# Patient Record
Sex: Female | Born: 1975
Health system: Southern US, Community
[De-identification: ages and names within clinical notes are randomized; demographics above are authoritative.]

## PROBLEM LIST (undated history)

## (undated) DIAGNOSIS — E559 Vitamin D deficiency, unspecified: Secondary | ICD-10-CM

## (undated) DIAGNOSIS — K219 Gastro-esophageal reflux disease without esophagitis: Secondary | ICD-10-CM

## (undated) DIAGNOSIS — M722 Plantar fascial fibromatosis: Secondary | ICD-10-CM

## (undated) DIAGNOSIS — S92912A Unspecified fracture of left toe(s), initial encounter for closed fracture: Secondary | ICD-10-CM

## (undated) DIAGNOSIS — Z8619 Personal history of other infectious and parasitic diseases: Secondary | ICD-10-CM

## (undated) DIAGNOSIS — S9304XA Dislocation of right ankle joint, initial encounter: Secondary | ICD-10-CM

## (undated) DIAGNOSIS — F419 Anxiety disorder, unspecified: Secondary | ICD-10-CM

## (undated) HISTORY — DX: Anxiety disorder, unspecified: F41.9

## (undated) HISTORY — DX: Personal history of other infectious and parasitic diseases: Z86.19

## (undated) HISTORY — DX: Unspecified fracture of left toe(s), initial encounter for closed fracture: S92.912A

## (undated) HISTORY — DX: Plantar fascial fibromatosis: M72.2

## (undated) HISTORY — PX: TENDON REPAIR: SHX5111

## (undated) HISTORY — DX: Dislocation of right ankle joint, initial encounter: S93.04XA

## (undated) HISTORY — DX: Gastro-esophageal reflux disease without esophagitis: K21.9

## (undated) HISTORY — DX: Vitamin D deficiency, unspecified: E55.9

---

## 2014-10-30 ENCOUNTER — Encounter (INDEPENDENT_AMBULATORY_CARE_PROVIDER_SITE_OTHER): Payer: Self-pay

## 2014-10-30 ENCOUNTER — Encounter: Payer: Self-pay | Admitting: Internal Medicine

## 2014-10-30 ENCOUNTER — Ambulatory Visit (INDEPENDENT_AMBULATORY_CARE_PROVIDER_SITE_OTHER): Payer: 59 | Admitting: Internal Medicine

## 2014-10-30 VITALS — BP 108/68 | HR 84 | Temp 98.4°F | Ht 65.0 in | Wt 173.2 lb

## 2014-10-30 DIAGNOSIS — R05 Cough: Secondary | ICD-10-CM

## 2014-10-30 DIAGNOSIS — R059 Cough, unspecified: Secondary | ICD-10-CM

## 2014-10-30 MED ORDER — PANTOPRAZOLE SODIUM 40 MG PO TBEC: 40.0000 mg | DELAYED_RELEASE_TABLET | Freq: Every day | ORAL | Status: DC

## 2014-10-30 NOTE — Progress Notes (Signed)
Subjective:    Patient ID: Sierra Duncan, female    DOB: 09/07/1976,     MRN: [id number]  HPI  37 yowf light smoker stopped 2001 no trouble whatsover then one year p childbirth while breast feeding in 2008  while living in [place] developed sense of sob while breathswere full ? Some better with saba then on and off sob no better on advair until 2013 eval in [place] > neg allergies / some hives from eggs ? Anxious when active rash / rx with prn saba and arrived in [place] Sept 2015 and referred to pulmonary clinic  By Cammie Fulp.  10/30/2014 1st [facility] office visit/ Viraj Liby   Chief Complaint  Patient presents with  . Pulmonary Consult    Referred by Dr. Antony Blackbird. Pt c/o cough and SOB fr the past 4-6 yrs- worse when it rains. She uses ventolin about 1 x per wk on average.     Does wake up on back feeling pressure immediately lies on side and feels better s using saba  Ex does fine but no aerobics all indoors  Cough and tightness usually occur together during the day but note  does not cough noct when awakes with chest pressure.  No obvious other patterns in day to day or daytime variabilty or assoc chronic cough or cp or chest tightness, subjective wheeze overt sinus or hb symptoms. No unusual exp hx or h/o childhood pna/ asthma or knowledge of premature birth.  Sleeping ok without nocturnal  or early am exacerbation  of respiratory  c/o's or need for noct saba. Also denies any obvious fluctuation of symptoms with weather or environmental changes or other aggravating or alleviating factors except as outlined above   Current Medications, Allergies, Complete Past Medical History, Past Surgical History, Family History, and Social History were reviewed in [employer] record.                Review of Systems  Constitutional: Negative for fever, chills and unexpected weight change.  HENT: Negative for congestion, dental problem, ear pain, nosebleeds,  postnasal drip, rhinorrhea, sinus pressure, sneezing, sore throat, trouble swallowing and voice change.   Eyes: Negative for visual disturbance.  Respiratory: Positive for cough and shortness of breath. Negative for choking.   Cardiovascular: Negative for chest pain and leg swelling.  Gastrointestinal: Negative for vomiting, abdominal pain and diarrhea.  Genitourinary: Negative for difficulty urinating.       Acid heartburn  Indigestion  Musculoskeletal: Negative for arthralgias.  Skin: Negative for rash.  Neurological: Negative for tremors, syncope and headaches.  Hematological: Does not bruise/bleed easily.       Objective:   Physical Exam  Amb pleasant wf nad  Wt Readings from Last 3 Encounters:  10/30/14 173 lb 3.2 oz (78.563 kg)    Vital signs reviewed  HEENT: nl dentition, turbinates, and orophanx. Nl external ear canals without cough reflex   NECK :  without JVD/Nodes/TM/ nl carotid upstrokes bilaterally   LUNGS: no acc muscle use, clear to A and P bilaterally without cough on insp or exp maneuvers   CV:  RRR  no s3 or murmur or increase in P2, no edema   ABD:  soft and nontender with nl excursion in the supine position. No bruits or organomegaly, bowel sounds nl  MS:  warm without deformities, calf tenderness, cyanosis or clubbing  SKIN: warm and dry without lesions    NEURO:  alert, approp, no deficits  Assessment & Plan:

## 2014-10-30 NOTE — Patient Instructions (Addendum)
Pantoprazole (protonix) 40 mg   Take 30-60 min before first meal of the day and Zantac 150  one bedtime until return to office - this is the best way to tell whether stomach acid is contributing to your problem.     GERD (REFLUX)  is an extremely common cause of respiratory symptoms just like yours , many times with no obvious heartburn at all.    It can be treated with medication, but also with lifestyle changes including avoidance of late meals, excessive alcohol, smoking cessation, and avoid fatty foods, chocolate, peppermint, colas, red wine, and acidic juices such as orange juice.  NO MINT OR MENTHOL PRODUCTS SO NO COUGH DROPS  USE SUGARLESS CANDY INSTEAD (Jolley ranchers or Stover's or Life Savers) or even ice chips will also do - the key is to swallow to prevent all throat clearing. NO OIL BASED VITAMINS - use powdered substitutes.   Only use your albuterol as a rescue medication to be used if you can't catch your breath by resting or doing a relaxed purse lip breathing pattern.  - The less you use it, the better it will work when you need it. - Ok to use up to 2 puffs  every 4 hours if you must but call for immediate appointment if use goes up over your usual need - Don't leave home without it !!  (think of it like the spare tire for your car)   Please schedule a follow up office visit in 4 weeks, sooner if needed pfts

## 2014-11-01 NOTE — Assessment & Plan Note (Addendum)
The most common causes of chronic cough in immunocompetent adults include the following: upper airway cough syndrome (UACS), previously referred to as postnasal drip syndrome (PNDS), which is caused by variety of rhinosinus conditions; (2) asthma; (3) GERD; (4) chronic bronchitis from cigarette smoking or other inhaled environmental irritants; (5) nonasthmatic eosinophilic bronchitis; and (6) bronchiectasis.   These conditions, singly or in combination, have accounted for up to 94% of the causes of chronic cough in prospective studies.   Other conditions have constituted no >6% of the causes in prospective studies These have included bronchogenic carcinoma, chronic interstitial pneumonia, sarcoidosis, left ventricular failure, ACEI-induced cough, and aspiration from a condition associated with pharyngeal dysfunction.    Chronic cough is often simultaneously caused by more than one condition. A single cause has been found from 38 to 82% of the time, multiple causes from 18 to 62%. Multiply caused cough has been the result of three diseases up to 42% of the time.       Based on hx and exam, this is most likely:  Cough variant asthma or more likley   Upper airway cough syndrome, so named because it's frequently impossible to sort out how much is  CR/sinusitis with freq throat clearing (which can be related to primary GERD)   vs  causing  secondary (" extra esophageal")  GERD from wide swings in gastric pressure that occur with throat clearing, often  promoting self use of mint and menthol lozenges that reduce the lower esophageal sphincter tone and exacerbate the problem further in a cyclical fashion.   These are the same pts (now being labeled as having "irritable larynx syndrome" by some cough centers) who not infrequently have a history of having failed to tolerate ace inhibitors,  dry powder inhalers or biphosphonates or report having atypical reflux symptoms that don't respond to standard doses of PPI  , and are easily confused as having aecopd or asthma flares by even experienced allergists/ pulmonologists.   The first step is to maximize acid suppression   then regroup if the cough  In 4 weeks with pfts and proceed to methacholine challenge if cough persists.  See instructions for specific recommendations which were reviewed directly with the patient who was given a copy with highlighter outlining the key components.

## 2014-11-27 ENCOUNTER — Ambulatory Visit (INDEPENDENT_AMBULATORY_CARE_PROVIDER_SITE_OTHER): Payer: 59 | Admitting: Internal Medicine

## 2014-11-27 ENCOUNTER — Encounter: Payer: Self-pay | Admitting: Internal Medicine

## 2014-11-27 ENCOUNTER — Ambulatory Visit (INDEPENDENT_AMBULATORY_CARE_PROVIDER_SITE_OTHER)
Admission: RE | Admit: 2014-11-27 | Discharge: 2014-11-27 | Disposition: A | Discharge status: Home / Self Care | Payer: 59 | Source: Ambulatory Visit | Attending: Internal Medicine | Admitting: Internal Medicine

## 2014-11-27 DIAGNOSIS — R059 Cough, unspecified: Secondary | ICD-10-CM

## 2014-11-27 DIAGNOSIS — R05 Cough: Secondary | ICD-10-CM

## 2014-11-27 LAB — PULMONARY FUNCTION TEST
DL/VA % pred: 105 %
DL/VA: 5.21 ml/min/mmHg/L
DLCO UNC % PRED: 109 %
DLCO unc: 28.01 ml/min/mmHg
FEF 25-75 POST: 3.18 L/s
FEF 25-75 Pre: 2.79 L/sec
FEF2575-%Change-Post: 14 %
FEF2575-%PRED-POST: 97 %
FEF2575-%Pred-Pre: 85 %
FEV1-%Change-Post: 3 %
FEV1-%PRED-POST: 105 %
FEV1-%PRED-PRE: 101 %
FEV1-POST: 3.31 L
FEV1-PRE: 3.19 L
FEV1FVC-%Change-Post: 4 %
FEV1FVC-%PRED-PRE: 93 %
FEV6-%Change-Post: 0 %
FEV6-%Pred-Post: 108 %
FEV6-%Pred-Pre: 109 %
FEV6-Post: 4.09 L
FEV6-Pre: 4.12 L
FEV6FVC-%CHANGE-POST: 0 %
FEV6FVC-%Pred-Post: 102 %
FEV6FVC-%Pred-Pre: 102 %
FVC-%CHANGE-POST: 0 %
FVC-%PRED-PRE: 107 %
FVC-%Pred-Post: 106 %
FVC-Post: 4.09 L
FVC-Pre: 4.12 L
POST FEV1/FVC RATIO: 81 %
PRE FEV1/FVC RATIO: 77 %
PRE FEV6/FVC RATIO: 100 %
Post FEV6/FVC ratio: 100 %
RV % PRED: 96 %
RV: 1.55 L
TLC % pred: 107 %
TLC: 5.6 L

## 2014-11-27 NOTE — Progress Notes (Signed)
Subjective:    Patient ID: Sierra Duncan, female    DOB: 14-Jul-1976    MRN: [id number]    Brief patient profile: 10 yowf light smoker stopped 2001 no trouble whatsover then one year p childbirth while breast feeding in 2008  while living in [place] developed sense of sob while breasts were full ? Some better with saba then on and off sob no better on advair until 2013 eval in [place] > neg allergies / some hives from eggs ? More Anxious when active rash assoc sob/ rx with prn saba and arrived in [place] Sept 2015 and referred to pulmonary clinic 10/30/14   By Cammie Fulp    History of Present Illness  10/30/2014 1st [facility] office visit/ Wert   Chief Complaint  Patient presents with  . Pulmonary Consult    Referred by Dr. Antony Blackbird. Pt c/o cough and SOB fr the past 4-6 yrs- worse when it rains. She uses ventolin about 1 x per wk on average.   Does wake up on back feeling pressure immediately better p lies on side and feels better s using saba  Ex does fine but no aerobics, all indoors  Cough and tightness usually occur together during the day but note  does not cough noct when awakes with chest pressure. rec Pantoprazole (protonix) 40 mg   Take 30-60 min before first meal of the day and Zantac 150  one bedtime until return to office - this is the best way to tell whether stomach acid is contributing to your problem.   GERD diet  Only use your albuterol as a rescue medication     11/27/2014 f/u ov/Wert re:  Chief Complaint  Patient presents with  . Follow-up    PFT done today. Pt states that her cough and SOB are unchanged. No new co's today. She uses rescue inhaler on average 2 x per month.    no longer having problems lying on back, good activity tolerance. Main issue about half the mornings she  wakes up then coughs mostly dry but lasts up to 2 h,  not consistently on hs h2 as rec    No obvious day to day or daytime variabilty or assoc excess or pululent sputum or cp or  chest tightness, subjective wheeze overt sinus or hb symptoms. No unusual exp hx or h/o childhood pna/ asthma or knowledge of premature birth.  Sleeping ok without nocturnal  or early am exacerbation  of respiratory  c/o's or need for noct saba. Also denies any obvious fluctuation of symptoms with weather or environmental changes or other aggravating or alleviating factors except as outlined above   Current Medications, Allergies, Complete Past Medical History, Past Surgical History, Family History, and Social History were reviewed in [employer] record.  ROS  The following are not active complaints unless bolded sore throat, dysphagia, dental problems, itching, sneezing,  nasal congestion or excess/ purulent secretions, ear ache,   fever, chills, sweats, unintended wt loss, pleuritic or exertional cp, hemoptysis,  orthopnea pnd or leg swelling, presyncope, palpitations, heartburn, abdominal pain, anorexia, nausea, vomiting, diarrhea  or change in bowel or urinary habits, change in stools or urine, dysuria,hematuria,  rash, arthralgias, visual complaints, headache, numbness weakness or ataxia or problems with walking or coordination,  change in mood/affect or memory.                          Objective:   Physical Exam  Amb pleasant wf nad  Wt Readings from Last 3 Encounters:  11/27/14 172 lb (78.019 kg)  10/30/14 173 lb 3.2 oz (78.563 kg)    Vital signs reviewed    Vital signs reviewed  HEENT: nl dentition, turbinates, and orophanx. Nl external ear canals without cough reflex   NECK :  without JVD/Nodes/TM/ nl carotid upstrokes bilaterally   LUNGS: no acc muscle use, clear to A and P bilaterally without cough on insp or exp maneuvers   CV:  RRR  no s3 or murmur or increase in P2, no edema   ABD:  soft and nontender with nl excursion in the supine position. No bruits or organomegaly, bowel sounds nl  MS:  warm without deformities, calf tenderness,  cyanosis or clubbing     CXR PA and Lateral:   11/27/2014 : No active cardiopulmonary disease.      Assessment & Plan:

## 2014-11-27 NOTE — Progress Notes (Signed)
PFT done today. 

## 2014-11-27 NOTE — Patient Instructions (Addendum)
Please remember to go to the   x-ray department downstairs for your tests - we will call you with the results when they are available.    Pantoprazole (protonix) 40 mg   Take 30-60 min before first meal of the day and Zantac 150  one bedtime  Plus chlortrimeton 4 mg take 1-2 at bedtime  For daytime tickle/itching/ drainage>also  take chlortrimeton (chlorpheniramine) 4 mg every 4 hours available over the counter (may cause drowsiness)   GERD (REFLUX)  is an extremely common cause of respiratory symptoms just like yours , many times with no obvious heartburn at all.    It can be treated with medication, but also with lifestyle changes including avoidance of late meals, excessive alcohol, smoking cessation, and avoid fatty foods, chocolate, peppermint, colas, red wine, and acidic juices such as orange juice.  NO MINT OR MENTHOL PRODUCTS SO NO COUGH DROPS  USE SUGARLESS CANDY INSTEAD (Jolley ranchers or Stover's or Life Savers) or even ice chips will also do - the key is to swallow to prevent all throat clearing. NO OIL BASED VITAMINS - use powdered substitutes.   If you are satisfied with your treatment plan,  let your doctor know and he/she can either refill your medications or you can return here when your prescription runs out.     If in any way you are not 100% satisfied,  please tell us.  If 100% better, tell your friends!  Pulmonary follow up is as needed - Dr Neldon Mc could see for the itching issue if persists

## 2014-11-28 NOTE — Progress Notes (Signed)
Quick Note:  LMTCB ______ 

## 2014-11-30 NOTE — Assessment & Plan Note (Addendum)
-   GERD rx started 10/30/14 > improved 11/27/14  - PFTs 11/27/14 perfectly nl including fef 25-75 off all inhalers   I had an extended discussion with the patient reviewing all relevant studies completed to date and  lasting 15 to 20 minutes of a 25 minute visit on the following ongoing concerns:   1) no evidence at all to support asthma or any airflow obst of any kind 2) improvement on gerd rx strongly suggests LPR which may be partly related to effects of body habitus or use of bcp's or ongoing dietary factors  She does have problem with hives and gets anxious with this which may contribute to her resp complaints so would certainly consider referral to Dr Neldon Mc wot sort this out   Pulmonary f/u can be prn

## 2014-12-03 ENCOUNTER — Other Ambulatory Visit: Payer: Self-pay | Admitting: Internal Medicine

## 2014-12-03 MED ORDER — PANTOPRAZOLE SODIUM 40 MG PO TBEC: 40.0000 mg | DELAYED_RELEASE_TABLET | Freq: Every day | ORAL | Status: DC

## 2015-02-05 ENCOUNTER — Other Ambulatory Visit: Payer: Self-pay | Admitting: Family Medicine

## 2015-02-05 DIAGNOSIS — N644 Mastodynia: Secondary | ICD-10-CM

## 2015-02-05 DIAGNOSIS — N6312 Unspecified lump in the right breast, upper inner quadrant: Secondary | ICD-10-CM

## 2015-02-11 ENCOUNTER — Ambulatory Visit
Admission: RE | Admit: 2015-02-11 | Discharge: 2015-02-11 | Disposition: A | Discharge status: Home / Self Care | Payer: 59 | Source: Ambulatory Visit | Attending: Family Medicine | Admitting: Family Medicine

## 2015-02-11 DIAGNOSIS — N6312 Unspecified lump in the right breast, upper inner quadrant: Secondary | ICD-10-CM

## 2015-02-11 DIAGNOSIS — N644 Mastodynia: Secondary | ICD-10-CM

## 2015-02-17 ENCOUNTER — Other Ambulatory Visit: Payer: Self-pay | Admitting: *Deleted

## 2015-02-17 DIAGNOSIS — M79606 Pain in leg, unspecified: Secondary | ICD-10-CM

## 2015-02-17 DIAGNOSIS — M7989 Other specified soft tissue disorders: Secondary | ICD-10-CM

## 2015-03-31 ENCOUNTER — Encounter: Payer: 59 | Admitting: Vascular Surgery

## 2015-03-31 ENCOUNTER — Encounter (HOSPITAL_COMMUNITY): Payer: 59

## 2016-02-08 ENCOUNTER — Other Ambulatory Visit: Payer: Self-pay | Admitting: Family Medicine

## 2016-02-08 ENCOUNTER — Other Ambulatory Visit (HOSPITAL_COMMUNITY)
Admission: RE | Admit: 2016-02-08 | Discharge: 2016-02-08 | Disposition: A | Discharge status: Home / Self Care | Payer: 59 | Source: Ambulatory Visit | Attending: Family Medicine | Admitting: Family Medicine

## 2016-02-08 DIAGNOSIS — Z01419 Encounter for gynecological examination (general) (routine) without abnormal findings: Secondary | ICD-10-CM | POA: Insufficient documentation

## 2016-02-08 LAB — LIPID PANEL
Cholesterol: 224 mg/dL — AB (ref 0–200)
HDL: 42 mg/dL (ref 35–70)
LDL CALC: 182 mg/dL
LDL/HDL RATIO: 5.3
TRIGLYCERIDES: 242 mg/dL — AB (ref 40–160)

## 2016-02-08 LAB — BASIC METABOLIC PANEL
BUN: 19 mg/dL (ref 4–21)
CREATININE: 0.7 mg/dL (ref 0.5–1.1)
Glucose: 96 mg/dL
Potassium: 3.8 mmol/L (ref 3.4–5.3)
SODIUM: 139 mmol/L (ref 137–147)

## 2016-02-08 LAB — HM PAP SMEAR

## 2016-02-09 LAB — CYTOLOGY - PAP

## 2016-03-31 ENCOUNTER — Other Ambulatory Visit: Payer: Self-pay | Admitting: Orthopedic Surgery

## 2016-03-31 DIAGNOSIS — M5412 Radiculopathy, cervical region: Secondary | ICD-10-CM

## 2016-04-08 ENCOUNTER — Other Ambulatory Visit: Payer: 59

## 2016-04-13 ENCOUNTER — Ambulatory Visit
Admission: RE | Admit: 2016-04-13 | Discharge: 2016-04-13 | Disposition: A | Discharge status: Home / Self Care | Payer: 59 | Source: Ambulatory Visit | Attending: Orthopedic Surgery | Admitting: Orthopedic Surgery

## 2016-04-13 DIAGNOSIS — M5412 Radiculopathy, cervical region: Secondary | ICD-10-CM

## 2016-04-25 ENCOUNTER — Ambulatory Visit (INDEPENDENT_AMBULATORY_CARE_PROVIDER_SITE_OTHER): Payer: 59 | Admitting: Neurology

## 2016-04-25 ENCOUNTER — Encounter: Payer: Self-pay | Admitting: Neurology

## 2016-04-25 VITALS — BP 135/83 | HR 87 | Ht 65.0 in | Wt 185.6 lb

## 2016-04-25 DIAGNOSIS — M542 Cervicalgia: Secondary | ICD-10-CM

## 2016-04-25 DIAGNOSIS — R208 Other disturbances of skin sensation: Secondary | ICD-10-CM

## 2016-04-25 DIAGNOSIS — G518 Other disorders of facial nerve: Secondary | ICD-10-CM

## 2016-04-25 DIAGNOSIS — R200 Anesthesia of skin: Secondary | ICD-10-CM | POA: Insufficient documentation

## 2016-04-25 NOTE — Progress Notes (Signed)
[facility] [address] [place]. [place] [address] [phone]       OFFICE CONSULT NOTE  Ms. Sierra Duncan Date of Birth:  Aug 05, 1976 Medical Record Number:  [id number]   Referring MD:  Sharol Harness  Reason for Referral: Left face numbness  HPI: 40 year Caucasian lady who's been having intermittent left facial numbness since the last 1 year. She actually states that she had somewhat similar episode at age 40 when with sudden neck extension she noticed left facial numbness. This lasted a few days and resolved. She did not get medical help at that time. A year ago now when she was in the shower and extending her neck up she noticed numbness involving the left half of the face. Since then this seems to occur off and on. This is often brought on by neck movements and activities which involve extension. She can feel tightness over the left occipital groove and at times by local pressure better she can avoid development of numbness involving the whole face. She recently had a bout of coughing following upper respiratory tract infection which had triggered the most recent bout of numbness. She denies neck pain, shooting pain, neuralgic pain. She is quite active and exercises regularly in the gym. She recently had MRI scan of the cervical spine performed on 04/13/16 which I have personally reviewed shows no abnormality. She has had no injections into the left occipital groove point was fine. She denies any other multiple sclerosis-like symptoms in the form of extremity numbness, weakness, ataxia, vertigo, diplopia, or bladder incontinence or excessive fatigue. She is overall quite healthy except mild anxiety and gastric esophageal reflux. She recently underwent right ankle surgery in April 2017 and is currently wearing a right foot boot  ROS:   14 system review of systems is positive for numbness, anxiety and all other systems negative PMH:  Past Medical History  Diagnosis Date   . Anxiety   . GERD (gastroesophageal reflux disease)     Social History:  Social History   Social History  . Marital Status: Married    Spouse Name: N/A  . Number of Children: N/A  . Years of Education: N/A   Occupational History  . Homemaker    Social History Main Topics  . Smoking status: Former Smoker -- 0.25 packs/day for 5 years    Types: Cigarettes    Quit date: 12/19/1998  . Smokeless tobacco: Never Used  . Alcohol Use: 1.8 oz/week    1 Glasses of wine, 1 Cans of beer, 1 Shots of liquor, 0 Standard drinks or equivalent per week     Comment: 5 drinks per wk,doies weekly  . Drug Use: No  . Sexual Activity: Not on file   Other Topics Concern  . Not on file   Social History Narrative    Medications:   Current Outpatient Prescriptions on File Prior to Visit  Medication Sig Dispense Refill  . ALPRAZolam (XANAX) 0.5 MG tablet Take 0.25 mg by mouth 2 (two) times daily as needed for anxiety.    . ranitidine (ZANTAC) 75 MG tablet Take 75 mg by mouth daily as needed for heartburn. Reported on 04/25/2016     No current facility-administered medications on file prior to visit.    Allergies:   Allergies  Allergen Reactions  . Codeine Shortness Of Breath  . Tramadol Shortness Of Breath  . Eggs Or Egg-Derived Products Hives  . Fluoxetine Hives  . Macrobid [Nitrofurantoin] Hives  . Septra [Sulfamethoxazole-Trimethoprim] Hives  Physical Exam General: well developed, well nourished, seated, in no evident distress Head: head normocephalic and atraumatic.   Neck: supple with no carotid or supraclavicular bruits Cardiovascular: regular rate and rhythm, no murmurs Musculoskeletal: no deformity.Slight tenderness left.  occipital groove but Tinel sign is negative. Wearing a right foot boot Skin:  no rash/petichiae Vascular:  Normal pulses all extremities  Neurologic Exam Mental Status: Awake and fully alert. Oriented to place and time. Recent and remote memory intact.  Attention span, concentration and fund of knowledge appropriate. Mood and affect appropriate.  Cranial Nerves: Fundoscopic exam reveals sharp disc margins. Pupils equal, briskly reactive to light. Extraocular movements full without nystagmus. Visual fields full to confrontation. Hearing intact. Facial sensation intact. Face, tongue, palate moves normally and symmetrically.  Motor: Normal bulk and tone. Normal strength in all tested extremity muscles. Sensory.: intact to touch , pinprick , position and vibratory sensation.  Coordination: Rapid alternating movements normal in all extremities. Finger-to-nose and heel-to-shin performed accurately bilaterally. Gait and Station: Arises from chair without difficulty. Stance is normal. Gait demonstrates normal stride length and balance . Able to heel, toe and tandem walk without difficulty.  Reflexes: 1+ and symmetric. Toes downgoing.       ASSESSMENT: 71 year Caucasian lady with intermittent left facial numbness possibly from occipital nerve entrapment. Her clinical presentation is not typical for occipital neuralgia though her positional symptoms are suggestive of trigeminal nerve involvement.    PLAN: I had a long discussion with the patient regarding her symptoms of intermittent left facial numbness related to neck extension likely representing mild occipital neuralgia and absence of pain makes it unusual. I recommend checking MRI scan of the brain to look for craniovertebral junction lesions or brainstem involvement. Her symptoms are not frequent and severe enough to justify medications at the present time. She was advised to avoid sudden neck extensions. Greater than 50% time during this 30 minute consultation was spent on counseling and coordination of care about patient numbness She will return for follow-up in the future only as needed. Antony Contras, MD  [facility] [address] [place], [place]  [address]  Phone [phone] Fax [phone]   Note: This document was prepared with digital dictation and possible smart phrase technology. Any transcriptional errors that result from this process are unintentional.

## 2016-04-25 NOTE — Patient Instructions (Signed)
I had a long discussion with the patient regarding her symptoms of intermittent left facial numbness related to neck extension likely representing mild occipital neuralgia and absence of pain makes it unusual. I recommend checking MRI scan of the brain to look for craniovertebral junction lesions or brainstem involvement. Her symptoms are not frequent and severe enough to justify medications at the present time. She was advised to avoid sudden neck extensions She will return for follow-up in the future only as needed.

## 2016-05-04 ENCOUNTER — Ambulatory Visit (INDEPENDENT_AMBULATORY_CARE_PROVIDER_SITE_OTHER): Payer: 59

## 2016-05-04 DIAGNOSIS — R208 Other disturbances of skin sensation: Secondary | ICD-10-CM

## 2016-05-04 DIAGNOSIS — G518 Other disorders of facial nerve: Secondary | ICD-10-CM

## 2016-05-04 DIAGNOSIS — R200 Anesthesia of skin: Secondary | ICD-10-CM

## 2016-05-04 MED ORDER — GADOPENTETATE DIMEGLUMINE 469.01 MG/ML IV SOLN: 17.0000 mL | Freq: Once | INTRAVENOUS | Status: AC | PRN

## 2016-05-17 ENCOUNTER — Telehealth: Payer: Self-pay

## 2016-05-17 NOTE — Telephone Encounter (Signed)
Rn call patient to give her MRI results. Rn stated he MRI was normal. Pt verbalized understanding.

## 2016-05-17 NOTE — Telephone Encounter (Signed)
-----   Message from Garvin Fila, MD sent at 05/13/2016  4:01 PM EDT ----- Mitchell Heir inform the patient that MRI brain study was normal

## 2016-08-08 LAB — LIPID PANEL
CHOLESTEROL: 206 mg/dL — AB (ref 0–200)
HDL: 44 mg/dL (ref 35–70)
LDL CALC: 162 mg/dL
Triglycerides: 154 mg/dL (ref 40–160)

## 2016-08-08 LAB — BASIC METABOLIC PANEL: Glucose: 96 mg/dL

## 2016-12-01 ENCOUNTER — Encounter: Payer: Self-pay | Admitting: General Practice

## 2017-01-16 DIAGNOSIS — M722 Plantar fascial fibromatosis: Secondary | ICD-10-CM | POA: Diagnosis not present

## 2017-01-16 DIAGNOSIS — M79671 Pain in right foot: Secondary | ICD-10-CM | POA: Diagnosis not present

## 2017-02-08 DIAGNOSIS — R1011 Right upper quadrant pain: Secondary | ICD-10-CM | POA: Diagnosis not present

## 2017-02-09 ENCOUNTER — Other Ambulatory Visit: Payer: Self-pay | Admitting: Family Medicine

## 2017-02-09 ENCOUNTER — Other Ambulatory Visit: Payer: Self-pay | Admitting: Family

## 2017-02-09 DIAGNOSIS — R1011 Right upper quadrant pain: Secondary | ICD-10-CM

## 2017-02-09 DIAGNOSIS — M722 Plantar fascial fibromatosis: Secondary | ICD-10-CM | POA: Diagnosis not present

## 2017-02-16 ENCOUNTER — Ambulatory Visit
Admission: RE | Admit: 2017-02-16 | Discharge: 2017-02-16 | Disposition: A | Discharge status: Home / Self Care | Payer: 59 | Source: Ambulatory Visit | Attending: Family | Admitting: Family

## 2017-02-16 DIAGNOSIS — R1011 Right upper quadrant pain: Secondary | ICD-10-CM

## 2017-03-09 DIAGNOSIS — M79671 Pain in right foot: Secondary | ICD-10-CM | POA: Diagnosis not present

## 2017-03-09 DIAGNOSIS — M722 Plantar fascial fibromatosis: Secondary | ICD-10-CM | POA: Diagnosis not present

## 2017-03-20 ENCOUNTER — Ambulatory Visit: Payer: 59 | Admitting: Family Medicine

## 2017-03-27 ENCOUNTER — Ambulatory Visit (INDEPENDENT_AMBULATORY_CARE_PROVIDER_SITE_OTHER): Payer: 59 | Admitting: Family Medicine

## 2017-03-27 ENCOUNTER — Encounter: Payer: Self-pay | Admitting: Family Medicine

## 2017-03-27 VITALS — BP 122/81 | HR 85 | Temp 98.1°F | Resp 16 | Ht 65.0 in | Wt 184.5 lb

## 2017-03-27 DIAGNOSIS — Z1239 Encounter for other screening for malignant neoplasm of breast: Secondary | ICD-10-CM

## 2017-03-27 DIAGNOSIS — K219 Gastro-esophageal reflux disease without esophagitis: Secondary | ICD-10-CM | POA: Diagnosis not present

## 2017-03-27 DIAGNOSIS — Z1231 Encounter for screening mammogram for malignant neoplasm of breast: Secondary | ICD-10-CM | POA: Diagnosis not present

## 2017-03-27 DIAGNOSIS — E663 Overweight: Secondary | ICD-10-CM

## 2017-03-27 DIAGNOSIS — E559 Vitamin D deficiency, unspecified: Secondary | ICD-10-CM | POA: Diagnosis not present

## 2017-03-27 LAB — BASIC METABOLIC PANEL
BUN: 22 mg/dL (ref 6–23)
CHLORIDE: 105 meq/L (ref 96–112)
CO2: 25 meq/L (ref 19–32)
Calcium: 9.4 mg/dL (ref 8.4–10.5)
Creatinine, Ser: 0.63 mg/dL (ref 0.40–1.20)
GFR: 111.01 mL/min (ref >60.00)
GLUCOSE: 95 mg/dL (ref 70–99)
POTASSIUM: 4.2 meq/L (ref 3.5–5.1)
SODIUM: 137 meq/L (ref 135–145)

## 2017-03-27 LAB — HEPATIC FUNCTION PANEL
ALK PHOS: 53 U/L (ref 39–117)
ALT: 21 U/L (ref 0–35)
AST: 16 U/L (ref 0–37)
Albumin: 4.1 g/dL (ref 3.5–5.2)
Bilirubin, Direct: 0.1 mg/dL (ref 0.0–0.3)
TOTAL PROTEIN: 6.8 g/dL (ref 6.0–8.3)
Total Bilirubin: 0.9 mg/dL (ref 0.2–1.2)

## 2017-03-27 LAB — CBC WITH DIFFERENTIAL/PLATELET
BASOS PCT: 0.4 % (ref 0.0–3.0)
Basophils Absolute: 0 10*3/uL (ref 0.0–0.1)
EOS ABS: 0.1 10*3/uL (ref 0.0–0.7)
EOS PCT: 1.2 % (ref 0.0–5.0)
HEMATOCRIT: 40.4 % (ref 36.0–46.0)
Hemoglobin: 14.2 g/dL (ref 12.0–15.0)
Lymphocytes Relative: 23.8 % (ref 12.0–46.0)
Lymphs Abs: 1.8 10*3/uL (ref 0.7–4.0)
MCHC: 35.1 g/dL (ref 30.0–36.0)
MCV: 92.2 fl (ref 78.0–100.0)
MONO ABS: 0.6 10*3/uL (ref 0.1–1.0)
Monocytes Relative: 7.7 % (ref 3.0–12.0)
NEUTROS ABS: 5.2 10*3/uL (ref 1.4–7.7)
Neutrophils Relative %: 66.9 % (ref 43.0–77.0)
PLATELETS: 209 10*3/uL (ref 150.0–400.0)
RBC: 4.38 Mil/uL (ref 3.87–5.11)
RDW: 12.8 % (ref 11.5–15.5)
WBC: 7.8 10*3/uL (ref 4.0–10.5)

## 2017-03-27 LAB — LIPID PANEL
CHOLESTEROL: 245 mg/dL — AB (ref 0–200)
HDL: 56.4 mg/dL (ref >39.00)
LDL Cholesterol: 151 mg/dL — ABNORMAL HIGH (ref 0–99)
NonHDL: 188.66
Total CHOL/HDL Ratio: 4
Triglycerides: 187 mg/dL — ABNORMAL HIGH (ref 0.0–149.0)
VLDL: 37.4 mg/dL (ref 0.0–40.0)

## 2017-03-27 LAB — TSH: TSH: 5.88 u[IU]/mL — ABNORMAL HIGH (ref 0.35–4.50)

## 2017-03-27 LAB — VITAMIN D 25 HYDROXY (VIT D DEFICIENCY, FRACTURES): VITD: 33.22 ng/mL (ref 30.00–100.00)

## 2017-03-27 MED ORDER — RANITIDINE HCL 300 MG PO TABS: 300.0000 mg | ORAL_TABLET | Freq: Every day | ORAL | 3 refills | Status: DC

## 2017-03-27 NOTE — Assessment & Plan Note (Signed)
Chronic problem.  Pt has been taking 50,000 units weekly for quite some time.  Repeat level and if needed, continue the 50,000 units and if levels are normal, start 2,000 units daily.  Pt expressed understanding and is in agreement w/ plan.

## 2017-03-27 NOTE — Assessment & Plan Note (Signed)
New to provider, ongoing for pt.  Increase dose to 300mg  nightly.  Reviewed dietary and lifestyle modifications.  Will follow.

## 2017-03-27 NOTE — Assessment & Plan Note (Signed)
New to provider, ongoing for pt.  She is exercising regularly now after making a commitment to becoming healthier this fall.  Check labs to risk stratify.  Will follow.

## 2017-03-27 NOTE — Patient Instructions (Signed)
Follow up in 1 year or as needed We'll notify you of your lab results and make any changes if needed Continue to work on healthy diet and regular exercise- you look great!! We'll call you with your mammogram appt Start the Ranitidine nightly when your prescription arrives Call with any questions or concerns Welcome!!!  We're glad to have you!!!

## 2017-03-27 NOTE — Progress Notes (Signed)
Pre visit review using our clinic review tool, if applicable. No additional management support is needed unless otherwise documented below in the visit note. 

## 2017-03-27 NOTE — Progress Notes (Signed)
   Subjective:    Patient ID: Sierra Duncan, female    DOB: [birth year], 41 y.o.   MRN: [id number]  HPI New to establish.  Previous MD- Fulp  UTD on pap- due 2020.  UTD on mammo- done last year at [facility]  GERD- currently on Zantac 150mg  daily.  Was previously on Pantoprozole daily but weaned off this.  Pt is not aware of triggers but still symptomatic.  Pt will require 2 Zantac daily on many occasions and this worries her.  Vit D deficiency- ongoing issue for pt.  She has been on 50,000 units weekly for quite some time.  Stopped ~6 weeks ago when energy level was good.  Overweight- pt's BMI is 30.70.  Exercising regularly- goes to gym 3x/week, walking dog daily.  No CP, SOB, HAs, visual changes, edema.   Review of Systems For ROS see HPI     Objective:   Physical Exam  Constitutional: She is oriented to person, place, and time. She appears well-developed and well-nourished. No distress.  overweight  HENT:  Head: Normocephalic and atraumatic.  Eyes: Conjunctivae and EOM are normal. Pupils are equal, round, and reactive to light.  Neck: Normal range of motion. Neck supple. No thyromegaly present.  Cardiovascular: Normal rate, regular rhythm, normal heart sounds and intact distal pulses.   No murmur heard. Pulmonary/Chest: Effort normal and breath sounds normal. No respiratory distress.  Abdominal: Soft. She exhibits no distension. There is no tenderness.  Musculoskeletal: She exhibits no edema.  Lymphadenopathy:    She has no cervical adenopathy.  Neurological: She is alert and oriented to person, place, and time.  Skin: Skin is warm and dry.  Psychiatric: She has a normal mood and affect. Her behavior is normal.  Vitals reviewed.         Assessment & Plan:

## 2017-03-28 ENCOUNTER — Other Ambulatory Visit: Payer: Self-pay | Admitting: General Practice

## 2017-03-28 DIAGNOSIS — R7989 Other specified abnormal findings of blood chemistry: Secondary | ICD-10-CM

## 2017-03-28 MED ORDER — LEVOTHYROXINE SODIUM 50 MCG PO TABS: 50.0000 ug | ORAL_TABLET | Freq: Every day | ORAL | 1 refills | Status: DC

## 2017-03-30 ENCOUNTER — Encounter: Payer: Self-pay | Admitting: General Practice

## 2017-03-30 ENCOUNTER — Telehealth: Payer: Self-pay | Admitting: Family Medicine

## 2017-03-30 DIAGNOSIS — G5791 Unspecified mononeuropathy of right lower limb: Secondary | ICD-10-CM | POA: Diagnosis not present

## 2017-03-30 DIAGNOSIS — M79671 Pain in right foot: Secondary | ICD-10-CM | POA: Diagnosis not present

## 2017-03-30 DIAGNOSIS — M722 Plantar fascial fibromatosis: Secondary | ICD-10-CM | POA: Diagnosis not present

## 2017-03-30 NOTE — Telephone Encounter (Signed)
Any short term side effects from thyroid medication will typically resolve in 3 days.  Pain is not a usual side effect but I won't say it's impossible.  Jaw pain is usually from clenching and the improvement w/ the deep breaths make it sound like it may be anxiety related.  I would give it a few days and see if things improve/resolve

## 2017-03-30 NOTE — Telephone Encounter (Signed)
Called pt twice and line was busy will try again later. Also sending a mychart message to inform pt.

## 2017-03-30 NOTE — Telephone Encounter (Signed)
Pt states that she is having pain in shoulders, between ribs and on right side of jaw. Pt states that she did not have this before starting thyroid Rx. Pt states that id she drank's a lot of water and takes in slow breathes that this seems to help, however it does come back, pt asking if this is normal.

## 2017-03-31 NOTE — Telephone Encounter (Signed)
Encounter closed as pt responded on mychart.

## 2017-04-04 ENCOUNTER — Encounter: Payer: Self-pay | Admitting: Physician Assistant

## 2017-04-04 ENCOUNTER — Ambulatory Visit (INDEPENDENT_AMBULATORY_CARE_PROVIDER_SITE_OTHER): Payer: 59 | Admitting: Physician Assistant

## 2017-04-04 VITALS — BP 130/86 | HR 81 | Temp 98.0°F | Resp 14 | Ht 65.0 in | Wt 186.0 lb

## 2017-04-04 DIAGNOSIS — M545 Low back pain, unspecified: Secondary | ICD-10-CM

## 2017-04-04 MED ORDER — CYCLOBENZAPRINE HCL 10 MG PO TABS: 10.0000 mg | ORAL_TABLET | Freq: Every day | ORAL | 0 refills | Status: DC

## 2017-04-04 NOTE — Progress Notes (Signed)
Patient presents to clinic today c/o 2 weeks of low back pain. Pain is described as bilateral with left worse than right. Is throbbing and pulling in nature. Worse with ROM or lifting. Denies radiation of pain.  Has seen chiropractor twice since onset of symptoms with some improvement. Denies numbness, tingling or weakness of extremities. Has taken tylenol with some relief.  Past Medical History:  Diagnosis Date  . Anxiety   . Dislocation of right talus   . GERD (gastroesophageal reflux disease)   . Great Toe Fracture, left   . History of chicken pox   . Plantar fasciitis   . Vitamin D deficiency     Current Outpatient Prescriptions on File Prior to Visit  Medication Sig Dispense Refill  . ALPRAZolam (XANAX) 0.5 MG tablet Take 0.25 mg by mouth daily as needed for anxiety.     Marland Kitchen levothyroxine (SYNTHROID, LEVOTHROID) 50 MCG tablet Take 1 tablet (50 mcg total) by mouth daily. 30 tablet 1  . mometasone (ELOCON) 0.1 % cream Apply 1 application topically daily.    . ranitidine (ZANTAC) 300 MG tablet Take 1 tablet (300 mg total) by mouth at bedtime. 90 tablet 3   Current Facility-Administered Medications on File Prior to Visit  Medication Dose Route Frequency Provider Last Rate Last Dose  . gadopentetate dimeglumine (MAGNEVIST) injection 17 mL  17 mL Intravenous Once PRN Garvin Fila, MD        Allergies  Allergen Reactions  . Codeine Anaphylaxis  . Tramadol Anaphylaxis  . Eggs Or Egg-Derived Products Hives and Other (See Comments)    inflammation  . Fluoxetine Hives  . Macrobid [Nitrofurantoin] Other (See Comments)    Loss of feeling with skin on legs  . Measles Mumps And Rubella Virus Vaccine Live Swelling    joints  . Septra [Sulfamethoxazole-Trimethoprim] Other (See Comments)    Loss of feeling with skin on legs    Family History  Problem Relation Age of Onset  . Hyperlipidemia Mother   . Hypertension Mother   . Diabetes Mother   . Other Mother     fatty liver  .  Hyperlipidemia Father   . Rheum arthritis Maternal Grandfather   . Healthy Daughter   . Cancer Maternal Grandmother     outside of uterine    Social History   Social History  . Marital status: Married    Spouse name: N/A  . Number of children: N/A  . Years of education: N/A   Occupational History  . Homemaker    Social History Main Topics  . Smoking status: Former Smoker    Packs/day: 0.25    Years: 5.00    Types: Cigarettes    Quit date: 12/19/1998  . Smokeless tobacco: Never Used  . Alcohol use 1.8 oz/week    1 Glasses of wine, 1 Cans of beer, 1 Shots of liquor per week     Comment: 5 drinks per wk,doies weekly  . Drug use: No  . Sexual activity: Not Asked   Other Topics Concern  . None   Social History Narrative  . None    Review of Systems - See HPI.  All other ROS are negative.  BP 130/86   Pulse 81   Temp 98 F (36.7 C) (Oral)   Resp 14   Ht 5\' 5"  (1.651 m)   Wt 186 lb (84.4 kg)   SpO2 99%   BMI 30.95 kg/m   Physical Exam  Constitutional: She is oriented to person,  place, and time and well-developed, well-nourished, and in no distress.  HENT:  Head: Normocephalic and atraumatic.  Eyes: Conjunctivae are normal.  Neck: Neck supple.  Cardiovascular: Normal rate, regular rhythm, normal heart sounds and intact distal pulses.   Pulmonary/Chest: Effort normal and breath sounds normal. No respiratory distress. She has no wheezes. She has no rales. She exhibits no tenderness.  Musculoskeletal:       Cervical back: Normal.       Thoracic back: Normal.       Lumbar back: She exhibits tenderness, pain and spasm. She exhibits normal range of motion and no bony tenderness.  Palpable spasm in left lumbar perispinal musculature. Pain is reproduces with flexion of spine, lateral bending and rotation.   Neurological: She is alert and oriented to person, place, and time.  Skin: Skin is warm and dry. No rash noted.  Psychiatric: Affect normal.  Vitals  reviewed.   Recent Results (from the past 2160 hour(s))  Lipid panel     Status: Abnormal   Collection Time: 03/27/17 12:01 PM  Result Value Ref Range   Cholesterol 245 (H) 0 - 200 mg/dL    Comment: ATP III Classification       Desirable:  < 200 mg/dL               Borderline High:  200 - 239 mg/dL          High:  > = 240 mg/dL   Triglycerides 187.0 (H) 0.0 - 149.0 mg/dL    Comment: Normal:  <150 mg/dLBorderline High:  150 - 199 mg/dL   HDL 56.40 >39.00 mg/dL   VLDL 37.4 0.0 - 40.0 mg/dL   LDL Cholesterol 151 (H) 0 - 99 mg/dL   Total CHOL/HDL Ratio 4     Comment:                Men          Women1/2 Average Risk     3.4          3.3Average Risk          5.0          4.42X Average Risk          9.6          7.13X Average Risk          15.0          11.0                       NonHDL 188.66     Comment: NOTE:  Non-HDL goal should be 30 mg/dL higher than patient's LDL goal (i.e. LDL goal of < 70 mg/dL, would have non-HDL goal of < 100 mg/dL)  Basic metabolic panel     Status: None   Collection Time: 03/27/17 12:01 PM  Result Value Ref Range   Sodium 137 135 - 145 mEq/L   Potassium 4.2 3.5 - 5.1 mEq/L   Chloride 105 96 - 112 mEq/L   CO2 25 19 - 32 mEq/L   Glucose, Bld 95 70 - 99 mg/dL   BUN 22 6 - 23 mg/dL   Creatinine, Ser 0.63 0.40 - 1.20 mg/dL   Calcium 9.4 8.4 - 10.5 mg/dL   GFR 111.01 >60.00 mL/min  TSH     Status: Abnormal   Collection Time: 03/27/17 12:01 PM  Result Value Ref Range   TSH 5.88 (H) 0.35 - 4.50 uIU/mL  Hepatic function panel  Status: None   Collection Time: 03/27/17 12:01 PM  Result Value Ref Range   Total Bilirubin 0.9 0.2 - 1.2 mg/dL   Bilirubin, Direct 0.1 0.0 - 0.3 mg/dL   Alkaline Phosphatase 53 39 - 117 U/L   AST 16 0 - 37 U/L   ALT 21 0 - 35 U/L   Total Protein 6.8 6.0 - 8.3 g/dL   Albumin 4.1 3.5 - 5.2 g/dL  VITAMIN D 25 Hydroxy (Vit-D Deficiency, Fractures)     Status: None   Collection Time: 03/27/17 12:01 PM  Result Value Ref Range   VITD  33.22 30.00 - 100.00 ng/mL  CBC with Differential/Platelet     Status: None   Collection Time: 03/27/17 12:01 PM  Result Value Ref Range   WBC 7.8 4.0 - 10.5 K/uL   RBC 4.38 3.87 - 5.11 Mil/uL   Hemoglobin 14.2 12.0 - 15.0 g/dL   HCT 40.4 36.0 - 46.0 %   MCV 92.2 78.0 - 100.0 fl   MCHC 35.1 30.0 - 36.0 g/dL   RDW 12.8 11.5 - 15.5 %   Platelets 209.0 150.0 - 400.0 K/uL   Neutrophils Relative % 66.9 43.0 - 77.0 %   Lymphocytes Relative 23.8 12.0 - 46.0 %   Monocytes Relative 7.7 3.0 - 12.0 %   Eosinophils Relative 1.2 0.0 - 5.0 %   Basophils Relative 0.4 0.0 - 3.0 %   Neutro Abs 5.2 1.4 - 7.7 K/uL   Lymphs Abs 1.8 0.7 - 4.0 K/uL   Monocytes Absolute 0.6 0.1 - 1.0 K/uL   Eosinophils Absolute 0.1 0.0 - 0.7 K/uL   Basophils Absolute 0.0 0.0 - 0.1 K/uL   Assessment/Plan: 1. Acute bilateral low back pain without sciatica No alarm signs/symptoms. Worse with ROM. Start Flexeril as directed. Supportive measures and OTC medications discussed. Follow-up if not resolving.     Leeanne Rio, PA-C

## 2017-04-04 NOTE — Progress Notes (Signed)
Pre visit review using our clinic review tool, if applicable. No additional management support is needed unless otherwise documented below in the visit note. 

## 2017-04-04 NOTE — Patient Instructions (Signed)
Please take the Flexeril as directed each evening. You can take up to 3 times per day but do not drive while on medication.  Take Extra Strength Tylenol for pain. Apply topical Biofreeze to the area. A heating pad may be beneficial. Avoid heavy lifting or overexertion.   If symptoms are not improving, we may need to add on a steroid.    Back Pain, Adult Back pain is very common. The pain often gets better over time. The cause of back pain is usually not dangerous. Most people can learn to manage their back pain on their own. Follow these instructions at home: Watch your back pain for any changes. The following actions may help to lessen any pain you are feeling:  Stay active. Start with short walks on flat ground if you can. Try to walk farther each day.  Exercise regularly as told by your doctor. Exercise helps your back heal faster. It also helps avoid future injury by keeping your muscles strong and flexible.  Do not sit, drive, or stand in one place for more than 30 minutes.  Do not stay in bed. Resting more than 1-2 days can slow down your recovery.  Be careful when you bend or lift an object. Use good form when lifting:  Bend at your knees.  Keep the object close to your body.  Do not twist.  Sleep on a firm mattress. Lie on your side, and bend your knees. If you lie on your back, put a pillow under your knees.  Take medicines only as told by your doctor.  Put ice on the injured area.  Put ice in a plastic bag.  Place a towel between your skin and the bag.  Leave the ice on for 20 minutes, 2-3 times a day for the first 2-3 days. After that, you can switch between ice and heat packs.  Avoid feeling anxious or stressed. Find good ways to deal with stress, such as exercise.  Maintain a healthy weight. Extra weight puts stress on your back. Contact a doctor if:  You have pain that does not go away with rest or medicine.  You have worsening pain that goes down into  your legs or buttocks.  You have pain that does not get better in one week.  You have pain at night.  You lose weight.  You have a fever or chills. Get help right away if:  You cannot control when you poop (bowel movement) or pee (urinate).  Your arms or legs feel weak.  Your arms or legs lose feeling (numbness).  You feel sick to your stomach (nauseous) or throw up (vomit).  You have belly (abdominal) pain.  You feel like you may pass out (faint). This information is not intended to replace advice given to you by your health care provider. Make sure you discuss any questions you have with your health care provider. Document Released: 05/23/2008 Document Revised: 05/12/2016 Document Reviewed: 04/08/2014 Elsevier Interactive Patient Education  2017 [employer].

## 2017-04-18 ENCOUNTER — Encounter: Payer: Self-pay | Admitting: General Practice

## 2017-04-23 ENCOUNTER — Encounter: Payer: Self-pay | Admitting: Family Medicine

## 2017-04-24 ENCOUNTER — Encounter: Payer: Self-pay | Admitting: Family Medicine

## 2017-04-24 ENCOUNTER — Telehealth: Payer: Self-pay | Admitting: *Deleted

## 2017-04-24 ENCOUNTER — Ambulatory Visit (INDEPENDENT_AMBULATORY_CARE_PROVIDER_SITE_OTHER): Payer: 59 | Admitting: Family Medicine

## 2017-04-24 DIAGNOSIS — M5410 Radiculopathy, site unspecified: Secondary | ICD-10-CM | POA: Insufficient documentation

## 2017-04-24 DIAGNOSIS — R946 Abnormal results of thyroid function studies: Secondary | ICD-10-CM | POA: Diagnosis not present

## 2017-04-24 DIAGNOSIS — R7989 Other specified abnormal findings of blood chemistry: Secondary | ICD-10-CM

## 2017-04-24 LAB — TSH: TSH: 1.2 u[IU]/mL (ref 0.35–4.50)

## 2017-04-24 MED ORDER — PREDNISONE 10 MG PO TABS: ORAL_TABLET | ORAL | 0 refills | Status: DC

## 2017-04-24 NOTE — Patient Instructions (Signed)
Follow up as needed/scheduled Start the Prednisone as directed- take w/ food.  3 pills at the same time x3 days, then 2 pills at the same time x3 days, and then 1 tab daily Use the Flexeril at night- 1/2 tab tab- to help w/ spasm and sleep Call with any questions or concerns Hang in there!!!

## 2017-04-24 NOTE — Telephone Encounter (Signed)
Attempted to do a PA for Ranitidine 300mg  - insurance company states that it will have to be an appeal.    I have left a message for patient to call because I know she has also talked with the insurance company, and I want to know what information she has.

## 2017-04-24 NOTE — Assessment & Plan Note (Signed)
New to provider, ongoing for pt.  She is seeing Chiropractor regularly and felt she was getting better until yesterday when back 'siezed up'.  Pt has been intolerant to NSAIDs in the past.  Start Pred.  Reviewed proper use of Flexeril.  Reviewed supportive care and red flags that should prompt return.  Pt expressed understanding and is in agreement w/ plan.

## 2017-04-24 NOTE — Progress Notes (Signed)
Pre visit review using our clinic review tool, if applicable. No additional management support is needed unless otherwise documented below in the visit note. 

## 2017-04-24 NOTE — Progress Notes (Signed)
   Subjective:    Patient ID: Sierra Duncan, female    DOB: 1976-02-25, 41 y.o.   MRN: [id number]  HPI LBP- pt was seen on 4/17 for similar.  Has been seeing Chiropractor weekly and thought she was getting better until Sunday when 'something seized' and she spent the day in bed.  Used OTC Tens unit yesterday w/ some relief.  Taking Tylenol, using Bengay.  Pain is radiating into buttocks and hips.  Pain is L>R.  Severe pain w/ forward flexion.  Pt had difficult time w/ Flexeril due to excessive sedation.  Has been avoiding NSAIDs due to hx of GI issues.   Review of Systems For ROS see HPI     Objective:   Physical Exam  Constitutional: She is oriented to person, place, and time. She appears well-developed and well-nourished. No distress.  HENT:  Head: Normocephalic and atraumatic.  Cardiovascular: Intact distal pulses.   Musculoskeletal: She exhibits tenderness (TTP just L of lumbar spine and L glute). She exhibits no deformity.  Pain w/ forward flexion, good extension  Neurological: She is alert and oriented to person, place, and time. She has normal reflexes.  + SLR bilaterally  Skin: Skin is warm and dry. No rash noted.  Psychiatric: She has a normal mood and affect. Her behavior is normal. Thought content normal.  Vitals reviewed.         Assessment & Plan:

## 2017-04-27 ENCOUNTER — Other Ambulatory Visit: Payer: 59

## 2017-05-03 ENCOUNTER — Other Ambulatory Visit: Payer: Self-pay | Admitting: *Deleted

## 2017-05-03 MED ORDER — RANITIDINE HCL 300 MG PO TABS: 300.0000 mg | ORAL_TABLET | Freq: Every day | ORAL | 3 refills | Status: DC

## 2017-05-03 NOTE — Telephone Encounter (Signed)
Spoke with patient this morning and she is going to just pay out of pocket right now and continue buying over the counter.   If she decides to move forward with an appeal for insurance she will let us know what is needed from [place] - but she is okay on medication for right now.

## 2017-05-17 ENCOUNTER — Other Ambulatory Visit: Payer: Self-pay | Admitting: Family Medicine

## 2017-06-07 ENCOUNTER — Telehealth: Payer: Self-pay | Admitting: Family Medicine

## 2017-06-07 NOTE — Telephone Encounter (Signed)
It is highly unlikely that her itchy skin has anything to do with her thyroid medication.  Most likely the flight was dehydrating, as international travel always is, and her skin is dry.  Also, changing personal products like soaps, lotions, hotel laundry detergent can all impact skin.  Recommend increased hydration, use of lotion, and antihistamine like Claritin or Zyrtec if itching is too bothersome

## 2017-06-07 NOTE — Telephone Encounter (Signed)
Patient notified of PCP recommendations and is agreement and expresses an understanding.  

## 2017-06-07 NOTE — Telephone Encounter (Signed)
Pt states that she is traveling in [place] and has developed itchy skin, pt thought is might have been a bug bite, but does not see any markings. Pt now thinks that it may be due to her thyroid meds and asking if she should cut back to a half a pill. Pt states that it is ok to leave detailed messages on her cell

## 2017-06-27 ENCOUNTER — Encounter: Payer: Self-pay | Admitting: Internal Medicine

## 2017-06-27 ENCOUNTER — Other Ambulatory Visit: Payer: Self-pay | Admitting: Family Medicine

## 2017-06-27 ENCOUNTER — Ambulatory Visit (INDEPENDENT_AMBULATORY_CARE_PROVIDER_SITE_OTHER): Payer: 59 | Admitting: Family Medicine

## 2017-06-27 ENCOUNTER — Encounter: Payer: Self-pay | Admitting: Family Medicine

## 2017-06-27 VITALS — BP 123/80 | HR 81 | Temp 98.1°F | Resp 16 | Ht 65.0 in | Wt 179.5 lb

## 2017-06-27 DIAGNOSIS — L299 Pruritus, unspecified: Secondary | ICD-10-CM | POA: Diagnosis not present

## 2017-06-27 DIAGNOSIS — K8050 Calculus of bile duct without cholangitis or cholecystitis without obstruction: Secondary | ICD-10-CM | POA: Insufficient documentation

## 2017-06-27 DIAGNOSIS — Z1231 Encounter for screening mammogram for malignant neoplasm of breast: Secondary | ICD-10-CM

## 2017-06-27 LAB — CBC WITH DIFFERENTIAL/PLATELET
BASOS ABS: 0 10*3/uL (ref 0.0–0.1)
Basophils Relative: 0.3 % (ref 0.0–3.0)
Eosinophils Absolute: 0.1 10*3/uL (ref 0.0–0.7)
Eosinophils Relative: 1.9 % (ref 0.0–5.0)
HCT: 41.9 % (ref 36.0–46.0)
HEMOGLOBIN: 14.5 g/dL (ref 12.0–15.0)
LYMPHS ABS: 1.4 10*3/uL (ref 0.7–4.0)
Lymphocytes Relative: 20.3 % (ref 12.0–46.0)
MCHC: 34.5 g/dL (ref 30.0–36.0)
MCV: 92.5 fl (ref 78.0–100.0)
MONO ABS: 0.6 10*3/uL (ref 0.1–1.0)
Monocytes Relative: 8.2 % (ref 3.0–12.0)
NEUTROS PCT: 69.3 % (ref 43.0–77.0)
Neutro Abs: 4.9 10*3/uL (ref 1.4–7.7)
Platelets: 233 10*3/uL (ref 150.0–400.0)
RBC: 4.53 Mil/uL (ref 3.87–5.11)
RDW: 13 % (ref 11.5–15.5)
WBC: 7 10*3/uL (ref 4.0–10.5)

## 2017-06-27 LAB — BASIC METABOLIC PANEL
BUN: 16 mg/dL (ref 6–23)
CHLORIDE: 102 meq/L (ref 96–112)
CO2: 26 mEq/L (ref 19–32)
CREATININE: 0.72 mg/dL (ref 0.40–1.20)
Calcium: 9.5 mg/dL (ref 8.4–10.5)
GFR: 95.04 mL/min (ref >60.00)
GLUCOSE: 97 mg/dL (ref 70–99)
POTASSIUM: 4.1 meq/L (ref 3.5–5.1)
Sodium: 137 mEq/L (ref 135–145)

## 2017-06-27 LAB — HEPATIC FUNCTION PANEL
ALBUMIN: 4.1 g/dL (ref 3.5–5.2)
ALT: 32 U/L (ref 0–35)
AST: 30 U/L (ref 0–37)
Alkaline Phosphatase: 55 U/L (ref 39–117)
Bilirubin, Direct: 0.1 mg/dL (ref 0.0–0.3)
Total Bilirubin: 0.8 mg/dL (ref 0.2–1.2)
Total Protein: 6.6 g/dL (ref 6.0–8.3)

## 2017-06-27 LAB — LIPASE: Lipase: 43 U/L (ref 11.0–59.0)

## 2017-06-27 LAB — TSH: TSH: 2.52 u[IU]/mL (ref 0.35–4.50)

## 2017-06-27 MED ORDER — CETIRIZINE HCL 10 MG PO TABS: 10.0000 mg | ORAL_TABLET | Freq: Every day | ORAL | 11 refills | Status: DC

## 2017-06-27 NOTE — Patient Instructions (Signed)
Follow up as needed We'll notify you of your lab results and make any changes if needed We'll call you with your Ultrasound appt and GI appt Try and avoid fatty foods- this typically worsens gallbladder symptoms Start the Zyrtec once daily- I sent a prescription but OTC is just as good (whichever is cheaper) Drink plenty of fluids Call with any questions or concerns Hang in there!!!

## 2017-06-27 NOTE — Progress Notes (Signed)
   Subjective:    Patient ID: Sierra Duncan, female    DOB: November 14, 1976, 41 y.o.   MRN: [id number]  HPI biliary colic- pt had 2 'super bad gallbladder attacks' while in [place] after eating [place].  No N/V but 'extreme pressure in R neck and shoulder and pressure between my shoulder blades'.  Had attack on plane on way home and then after eating eggs once she was home.  sxs improve w/ belching.  sxs resolve spontaneously.  Pt has been eating veggies and whole foods since then and has not had a problem in the last 2 days.  Itchy- pt reports she would have itching in the AM and PM, mostly in hands/feet.     Review of Systems For ROS see HPI     Objective:   Physical Exam  Constitutional: She is oriented to person, place, and time. She appears well-developed and well-nourished. No distress.  HENT:  Head: Normocephalic and atraumatic.  Eyes: Conjunctivae and EOM are normal. Pupils are equal, round, and reactive to light.  Neck: Normal range of motion. Neck supple. No thyromegaly present.  Cardiovascular: Normal rate, regular rhythm, normal heart sounds and intact distal pulses.   No murmur heard. Pulmonary/Chest: Effort normal and breath sounds normal. No respiratory distress.  Abdominal: Soft. She exhibits no distension. There is no tenderness. There is no rebound and no guarding.  Musculoskeletal: She exhibits no edema.  Lymphadenopathy:    She has no cervical adenopathy.  Neurological: She is alert and oriented to person, place, and time.  Skin: Skin is warm and dry. No rash noted. No erythema.  Psychiatric: She has a normal mood and affect. Her behavior is normal.  Vitals reviewed.         Assessment & Plan:  Pruritis- new.  Pt may have cholestasis that is causing her itching given her other sxs.  Check labs.  Start daily antihistamine as pt describes intermittent hives.  Will follow.

## 2017-06-27 NOTE — Progress Notes (Signed)
Pre visit review using our clinic review tool, if applicable. No additional management support is needed unless otherwise documented below in the visit note. 

## 2017-06-27 NOTE — Assessment & Plan Note (Signed)
New.  Pt reports she has had sxs previously but they have worsened in the last few weeks- likely due to her recent dietary changes.  Check labs.  Get [place].  Refer to GI.  Discussed need for low fat diet.  Will follow closely.

## 2017-06-28 ENCOUNTER — Encounter: Payer: Self-pay | Admitting: General Practice

## 2017-07-06 ENCOUNTER — Ambulatory Visit
Admission: RE | Admit: 2017-07-06 | Discharge: 2017-07-06 | Disposition: A | Discharge status: Home / Self Care | Payer: 59 | Source: Ambulatory Visit | Attending: Family Medicine | Admitting: Family Medicine

## 2017-07-06 DIAGNOSIS — K8050 Calculus of bile duct without cholangitis or cholecystitis without obstruction: Secondary | ICD-10-CM

## 2017-07-06 DIAGNOSIS — K760 Fatty (change of) liver, not elsewhere classified: Secondary | ICD-10-CM | POA: Diagnosis not present

## 2017-07-06 DIAGNOSIS — Z1231 Encounter for screening mammogram for malignant neoplasm of breast: Secondary | ICD-10-CM | POA: Diagnosis not present

## 2017-07-07 ENCOUNTER — Other Ambulatory Visit: Payer: Self-pay | Admitting: Family Medicine

## 2017-07-07 DIAGNOSIS — K760 Fatty (change of) liver, not elsewhere classified: Secondary | ICD-10-CM

## 2017-07-10 ENCOUNTER — Other Ambulatory Visit: Payer: Self-pay | Admitting: Family Medicine

## 2017-07-10 DIAGNOSIS — R928 Other abnormal and inconclusive findings on diagnostic imaging of breast: Secondary | ICD-10-CM

## 2017-07-14 ENCOUNTER — Other Ambulatory Visit: Payer: 59

## 2017-07-17 ENCOUNTER — Ambulatory Visit
Admission: RE | Admit: 2017-07-17 | Discharge: 2017-07-17 | Disposition: A | Discharge status: Home / Self Care | Payer: 59 | Source: Ambulatory Visit | Attending: Family Medicine | Admitting: Family Medicine

## 2017-07-17 ENCOUNTER — Other Ambulatory Visit: Payer: Self-pay | Admitting: Family Medicine

## 2017-07-17 DIAGNOSIS — R928 Other abnormal and inconclusive findings on diagnostic imaging of breast: Secondary | ICD-10-CM

## 2017-07-17 DIAGNOSIS — N6489 Other specified disorders of breast: Secondary | ICD-10-CM | POA: Diagnosis not present

## 2017-07-17 DIAGNOSIS — D242 Benign neoplasm of left breast: Secondary | ICD-10-CM

## 2017-07-17 DIAGNOSIS — R1011 Right upper quadrant pain: Secondary | ICD-10-CM | POA: Diagnosis not present

## 2017-07-17 DIAGNOSIS — R922 Inconclusive mammogram: Secondary | ICD-10-CM | POA: Diagnosis not present

## 2017-07-17 DIAGNOSIS — K760 Fatty (change of) liver, not elsewhere classified: Secondary | ICD-10-CM | POA: Diagnosis not present

## 2017-07-25 ENCOUNTER — Telehealth: Payer: Self-pay | Admitting: Family Medicine

## 2017-07-25 NOTE — Telephone Encounter (Signed)
Patient Name: Sierra Duncan  DOB: 1976-03-21    Initial Comment Caller says she is having anxiety attacks for a week, and increases her BP, she starts feeling dizzy. BP was 137/87 earlier brought it down to normal 114/80. Having some health issues that are creating anxiety. Did not take he antihistamine last night to avoid higher BP. Also takes other meds and Xanax when she feels anxiety attack coming on.    Nurse Assessment  Nurse: Tawanna Solo, RN, Vaughan Basta Date/Time (Eastern Time): 07/25/2017 5:04:55 PM  Confirm and document reason for call. If symptomatic, describe symptoms. ---Every day around 11:00AM having panic/anxiety attack, bp elevates, feels dizzy. BP was 137/87, now 114/80. Having some health issues that are causing anxiety. Caller takes xanax 0.5mg  prn. Caller is worried about her bp increasing.  Does the patient have any new or worsening symptoms? ---Yes  Will a triage be completed? ---Yes  Related visit to physician within the last 2 weeks? ---No  Does the PT have any chronic conditions? (i.e. diabetes, asthma, etc.) ---Yes  List chronic conditions. ---anxiety  Is the patient pregnant or possibly pregnant? (Ask all females between the ages of 41-55) ---No  Is this a behavioral health or substance abuse call? ---No     Guidelines    Guideline Title Affirmed Question Affirmed Notes  Anxiety and Panic Attack Symptoms interfere with work or school    Final Disposition User   See Physician within 24 Hours [address], RN, Vaughan Basta    Comments  Has hyperthyroidism  Returned call to the pt and appt made for 07/26/17 at 1400. Pt aware/agreeable.   Referrals  REFERRED TO PCP OFFICE   Disagree/Comply: Comply

## 2017-07-26 ENCOUNTER — Encounter: Payer: Self-pay | Admitting: Family Medicine

## 2017-07-26 ENCOUNTER — Ambulatory Visit (INDEPENDENT_AMBULATORY_CARE_PROVIDER_SITE_OTHER): Payer: 59 | Admitting: Family Medicine

## 2017-07-26 DIAGNOSIS — F419 Anxiety disorder, unspecified: Secondary | ICD-10-CM | POA: Diagnosis not present

## 2017-07-26 MED ORDER — ALPRAZOLAM 0.5 MG PO TABS: 0.2500 mg | ORAL_TABLET | Freq: Two times a day (BID) | ORAL | 1 refills | Status: DC | PRN

## 2017-07-26 MED ORDER — BUSPIRONE HCL 15 MG PO TABS: 7.5000 mg | ORAL_TABLET | Freq: Two times a day (BID) | ORAL | 3 refills | Status: DC

## 2017-07-26 NOTE — Assessment & Plan Note (Signed)
New to provider, ongoing for pt.  Sxs have acutely worsened in the last 2 weeks.  Having panic attacks almost daily.  Had allergic rxn to Prozac previously.  Denies depression.  Start Buspar twice daily and monitor for improvement.  Pt expressed understanding and is in agreement w/ plan.

## 2017-07-26 NOTE — Addendum Note (Signed)
Addended by: Midge Minium on: 07/26/2017 02:35 PM   Modules accepted: Orders

## 2017-07-26 NOTE — Progress Notes (Signed)
Pre visit review using our clinic review tool, if applicable. No additional management support is needed unless otherwise documented below in the visit note. 

## 2017-07-26 NOTE — Telephone Encounter (Signed)
FYI

## 2017-07-26 NOTE — Progress Notes (Signed)
   Subjective:    Patient ID: Sierra Duncan, female    DOB: 11-21-1976, 41 y.o.   MRN: [id number]  HPI Anxiety- pt reports 2 weeks of 'bad anxiety'.  Describes a 'rush in my head around 66 or 12' lunch time.  Thought it was related to antihistamine use so she stopped this 2 days and itching of hands returned.  Took Alprazolam today at 10am and did not have anxiety or head rush or elevated BP.  Pt has dramatically changed diet- has eliminated processed foods and refined sugars.  Pt has hx of anxiety- on Fluoxetine previously.   Review of Systems For ROS see HPI     Objective:   Physical Exam  Constitutional: She is oriented to person, place, and time. She appears well-developed and well-nourished. No distress.  HENT:  Head: Normocephalic and atraumatic.  Neurological: She is alert and oriented to person, place, and time.  Skin: Skin is warm and dry.  Psychiatric: She has a normal mood and affect. Her behavior is normal. Thought content normal.  Vitals reviewed.         Assessment & Plan:

## 2017-07-26 NOTE — Patient Instructions (Signed)
Follow up in 4 weeks to recheck anxiety Start the Buspirone 1/2 tab twice daily.  After 2 weeks, you can increase to 1 tab twice daily if needed for better anxiety control Restart the antihistamine but start 1/2 tab to control the itching Call with any questions or concerns Hang in there!!

## 2017-08-14 ENCOUNTER — Encounter: Payer: Self-pay | Admitting: Internal Medicine

## 2017-08-14 ENCOUNTER — Ambulatory Visit (INDEPENDENT_AMBULATORY_CARE_PROVIDER_SITE_OTHER): Payer: 59 | Admitting: Internal Medicine

## 2017-08-14 VITALS — BP 132/82 | HR 68 | Ht 65.0 in | Wt 176.0 lb

## 2017-08-14 DIAGNOSIS — K219 Gastro-esophageal reflux disease without esophagitis: Secondary | ICD-10-CM

## 2017-08-14 DIAGNOSIS — R1011 Right upper quadrant pain: Secondary | ICD-10-CM

## 2017-08-14 NOTE — Patient Instructions (Signed)
You have been scheduled for an endoscopy. Please follow written instructions given to you at your visit today. If you use inhalers (even only as needed), please bring them with you on the day of your procedure. Your physician has requested that you go to www.startemmi.com and enter the access code given to you at your visit today. This web site gives a general overview about your procedure. However, you should still follow specific instructions given to you by our office regarding your preparation for the procedure.   You have been scheduled for a HIDA scan at [facility] (1st floor) on ____ Please arrive 15 minutes prior to your scheduled appointment at  ____ Make certain not to have anything to eat or drink at least 6 hours prior to your test. Should this appointment date or time not work well for you, please call radiology scheduling at [phone].  _____________________________________________________________________ hepatobiliary (HIDA) scan is an imaging procedure used to diagnose problems in the liver, gallbladder and bile ducts. In the HIDA scan, a radioactive chemical or tracer is injected into a vein in your arm. The tracer is handled by the liver like bile. Bile is a fluid produced and excreted by your liver that helps your digestive system break down fats in the foods you eat. Bile is stored in your gallbladder and the gallbladder releases the bile when you eat a meal. A special nuclear medicine scanner (gamma camera) tracks the flow of the tracer from your liver into your gallbladder and small intestine.  During your HIDA scan  You'll be asked to change into a hospital gown before your HIDA scan begins. Your health care team will position you on a table, usually on your back. The radioactive tracer is then injected into a vein in your arm.The tracer travels through your bloodstream to your liver, where it's taken up by the bile-producing cells. The radioactive tracer travels with  the bile from your liver into your gallbladder and through your bile ducts to your small intestine.You may feel some pressure while the radioactive tracer is injected into your vein. As you lie on the table, a special gamma camera is positioned over your abdomen taking pictures of the tracer as it moves through your body. The gamma camera takes pictures continually for about an hour. You'll need to keep still during the HIDA scan. This can become uncomfortable, but you may find that you can lessen the discomfort by taking deep breaths and thinking about other things. Tell your health care team if you're uncomfortable. The radiologist will watch on a computer the progress of the radioactive tracer through your body. The HIDA scan may be stopped when the radioactive tracer is seen in the gallbladder and enters your small intestine. This typically takes about an hour. In some cases extra imaging will be performed if original images aren't satisfactory, if morphine is given to help visualize the gallbladder or if the medication CCK is given to look at the contraction of the gallbladder. This test typically takes 2 hours to complete. ________________________________________________________________________

## 2017-08-14 NOTE — Progress Notes (Signed)
HISTORY OF PRESENT ILLNESS:  Sierra Duncan is a 41 y.o. female, native of [place], who is sent today by her primary care provider Dr. Birdie Riddle with chief complaint of right upper quadrant pain which she feels may represent "gallbladder attacks". The patient reports that her problems began approximately 5 years ago she describes that on rare occasions after eating a heavy meal that she will begin to notice discomfort in her jaw, between her shoulder blades, and thereafter right upper quadrant pressure which feels firm or hard. Initially these episodes occurred approximately once per year. She did have an episode in January. While traveling in [place] in June she had 4 such episodes in 5 days. One episode thereafter. None since. She tells me that belching predictably will result in relief of her discomfort. As part of her evaluation she underwent blood work. Reviewed. Comprehensive metabolic panel, lipase, CBC, and TSH are all normal. Abdominal ultrasound was performed 07/06/2017. Normal gallbladder without stones. Said to have hepatic steatosis with coarse liver echotexture. For this she was sent to a hepatology specialist out of [facility]. The patient tells me that she underwent a laboratory workup which was negative. Her only other GI issue is heartburn for which she takes Zantac 300 mg at night. Occasional breakthrough symptoms with dietary indiscretion. No dysphagia.  REVIEW OF SYSTEMS:  All non-GI ROS negative unless otherwise stated in the history of present illness except for allergies, anxiety, itching, skin rash  Past Medical History:  Diagnosis Date  . Anxiety   . Dislocation of right talus   . GERD (gastroesophageal reflux disease)   . Great Toe Fracture, left   . History of chicken pox   . Plantar fasciitis   . Vitamin D deficiency     Past Surgical History:  Procedure Laterality Date  . CESAREAN SECTION  2006  . TENDON REPAIR      Social History Sierra Duncan  reports that she  quit smoking about 18 years ago. Her smoking use included Cigarettes. She has a 1.25 pack-year smoking history. She has never used smokeless tobacco. She reports that she drinks about 1.8 oz of alcohol per week . She reports that she does not use drugs.  family history includes Cancer in her maternal grandmother; Diabetes in her mother; Healthy in her daughter; Hyperlipidemia in her father and mother; Hypertension in her mother; Other in her mother; Rheum arthritis in her maternal grandfather.  Allergies  Allergen Reactions  . Codeine Anaphylaxis  . Tramadol Anaphylaxis  . Eggs Or Egg-Derived Products Hives and Other (See Comments)    inflammation  . Fluoxetine Hives  . Macrobid [Nitrofurantoin] Other (See Comments)    Loss of feeling with skin on legs  . Measles Mumps And Rubella Virus Vaccine Live Swelling    joints  . Septra [Sulfamethoxazole-Trimethoprim] Other (See Comments)    Loss of feeling with skin on legs       PHYSICAL EXAMINATION: Vital signs: BP 132/82   Pulse 68   Ht 5\' 5"  (1.651 m)   Wt 176 lb (79.8 kg)   BMI 29.29 kg/m   Constitutional: generally well-appearing, no acute distress Psychiatric: alert and oriented x3, cooperative Eyes: extraocular movements intact, anicteric, conjunctiva pink Mouth: oral pharynx moist, no lesions Neck: supple no lymphadenopathy Cardiovascular: heart regular rate and rhythm, no murmur Lungs: clear to auscultation bilaterally Abdomen: soft, nontender, nondistended, no obvious ascites, no peritoneal signs, normal bowel sounds, no organomegaly Rectal: Omitted Extremities: no clubbing cyanosis or lower extremity edema bilaterally Skin:  no lesions on visible extremities Neuro: No focal deficits. Cranial nerves intact  ASSESSMENT:  #1. Recurrent right upper quadrant pain relieved with belching. Rule out gas bloat syndrome #2. GERD without alarm features #3. Question of abnormal liver on ultrasound. Suspect benign fatty liver.  Negative workup with hepatologist group elsewhere #4. Obesity  PLAN:  #1. Schedule upper endoscopy to further evaluate pain and rule out other causes.The nature of the procedure, as well as the risks, benefits, and alternatives were carefully and thoroughly reviewed with the patient. Ample time for discussion and questions allowed. The patient understood, was satisfied, and agreed to proceed. #2. Schedule nuclear medicine hepatobiliary scan to rule out gallbladder dysfunction. We will contact the patient with the results when available #3. Discussed strategies to address gas bloat syndrome including the use of carbonated beverages #4. Reflux precautions #5. H2 receptor antagonist therapy as needed to control GERD symptoms #6. Weight loss  A copy of this consultation note has been sent to Dr. Birdie Riddle

## 2017-08-15 ENCOUNTER — Encounter: Payer: Self-pay | Admitting: Internal Medicine

## 2017-08-16 ENCOUNTER — Telehealth: Payer: Self-pay

## 2017-08-16 NOTE — Telephone Encounter (Signed)
Spoke with patient and told her that her Hida scan was scheduled at [facility] on 69/09/2017 at 7:30am.  Prep instructions were given in office on her AVS.  Patient verbalized understanding.

## 2017-08-22 ENCOUNTER — Encounter: Payer: Self-pay | Admitting: Internal Medicine

## 2017-08-22 ENCOUNTER — Ambulatory Visit (AMBULATORY_SURGERY_CENTER): Payer: 59 | Admitting: Internal Medicine

## 2017-08-22 VITALS — BP 139/83 | HR 69 | Temp 97.8°F | Resp 9 | Ht 65.0 in | Wt 176.0 lb

## 2017-08-22 DIAGNOSIS — R1011 Right upper quadrant pain: Secondary | ICD-10-CM

## 2017-08-22 MED ORDER — SODIUM CHLORIDE 0.9 % IV SOLN: 500.0000 mL | INTRAVENOUS | Status: DC

## 2017-08-22 NOTE — Progress Notes (Signed)
To recovery, report to RN, VSS. 

## 2017-08-22 NOTE — Op Note (Signed)
[facility] Patient Name: Sierra Duncan Procedure Date: 08/22/2017 9:46 AM MRN: [id number] Endoscopist: Docia Chuck. Henrene Pastor , MD Age: 41 Referring MD:  Date of Birth: 1976-05-06 Gender: Female Account #: [id number] Procedure:                Upper GI endoscopy Indications:              Abdominal pain in the right upper quadrant Medicines:                Monitored Anesthesia Care Procedure:                Pre-Anesthesia Assessment:                           - Prior to the procedure, a History and Physical                            was performed, and patient medications and                            allergies were reviewed. The patient's tolerance of                            previous anesthesia was also reviewed. The risks                            and benefits of the procedure and the sedation                            options and risks were discussed with the patient.                            All questions were answered, and informed consent                            was obtained. Prior Anticoagulants: The patient has                            taken no previous anticoagulant or antiplatelet                            agents. ASA Grade Assessment: II - A patient with                            mild systemic disease. After reviewing the risks                            and benefits, the patient was deemed in                            satisfactory condition to undergo the procedure.                           After obtaining informed consent, the endoscope was  passed under direct vision. Throughout the                            procedure, the patient's blood pressure, pulse, and                            oxygen saturations were monitored continuously. The                            Endoscope was introduced through the mouth, and                            advanced to the second part of duodenum. The upper                            GI endoscopy  was accomplished without difficulty.                            The patient tolerated the procedure well. Scope In: Scope Out: Findings:                 The esophagus was normal.                           The stomach was normal.                           The examined duodenum was normal.                           The cardia and gastric fundus were normal on                            retroflexion. Complications:            No immediate complications. Estimated Blood Loss:     Estimated blood loss: none. Impression:               - Normal esophagus.                           - Normal stomach.                           - Normal examined duodenum.                           - No specimens collected. Recommendation:           - Patient has a contact number available for                            emergencies. The signs and symptoms of potential                            delayed complications were discussed with the  patient. Return to normal activities tomorrow.                            Written discharge instructions were provided to the                            patient.                           - Resume previous diet.                           - Continue present medications.                           - Keep plans for gallbladder scan next week. We                            will contact you after the results are available Docia Chuck. Henrene Pastor, MD 08/22/2017 10:09:33 AM This report has been signed electronically.

## 2017-08-22 NOTE — Patient Instructions (Signed)
YOU HAD AN ENDOSCOPIC PROCEDURE TODAY AT [facility]:   Refer to the procedure report that was given to you for any specific questions about what was found during the examination.  If the procedure report does not answer your questions, please call your gastroenterologist to clarify.  If you requested that your care partner not be given the details of your procedure findings, then the procedure report has been included in a sealed envelope for you to review at your convenience later.  YOU SHOULD EXPECT: Some feelings of bloating in the abdomen. Passage of more gas than usual.  Walking can help get rid of the air that was put into your GI tract during the procedure and reduce the bloating. If you had a lower endoscopy (such as a colonoscopy or flexible sigmoidoscopy) you may notice spotting of blood in your stool or on the toilet paper. If you underwent a bowel prep for your procedure, you may not have a normal bowel movement for a few days.  Please Note:  You might notice some irritation and congestion in your nose or some drainage.  This is from the oxygen used during your procedure.  There is no need for concern and it should clear up in a day or so.  SYMPTOMS TO REPORT IMMEDIATELY:   Following upper endoscopy (EGD)  Vomiting of blood or coffee ground material  New chest pain or pain under the shoulder blades  Painful or persistently difficult swallowing  New shortness of breath  Fever of 100F or higher  Black, tarry-looking stools  For urgent or emergent issues, a gastroenterologist can be reached at any hour by calling [phone].   DIET:  We do recommend a small meal at first, but then you may proceed to your regular diet.  Drink plenty of fluids but you should avoid alcoholic beverages for 24 hours.  MEDICATIONS: Continue present medications.  Keep plans for gallbladder scan next week.  ACTIVITY:  You should plan to take it easy for the rest of today and you  should NOT DRIVE or use heavy machinery until tomorrow (because of the sedation medicines used during the test).    FOLLOW UP: Our staff will call the number listed on your records the next business day following your procedure to check on you and address any questions or concerns that you may have regarding the information given to you following your procedure. If we do not reach you, we will leave a message.  However, if you are feeling well and you are not experiencing any problems, there is no need to return our call.  We will assume that you have returned to your regular daily activities without incident.  If any biopsies were taken you will be contacted by phone or by letter within the next 1-3 weeks.  Please call us at [phone] if you have not heard about the biopsies in 3 weeks.   Thank you for allowing [place] to provide for your healthcare needs today.   SIGNATURES/CONFIDENTIALITY: You and/or your care partner have signed paperwork which will be entered into your electronic medical record.  These signatures attest to the fact that that the information above on your After Visit Summary has been reviewed and is understood.  Full responsibility of the confidentiality of this discharge information lies with you and/or your care-partner.

## 2017-08-23 ENCOUNTER — Ambulatory Visit (INDEPENDENT_AMBULATORY_CARE_PROVIDER_SITE_OTHER): Payer: 59 | Admitting: Family Medicine

## 2017-08-23 ENCOUNTER — Encounter: Payer: Self-pay | Admitting: Family Medicine

## 2017-08-23 ENCOUNTER — Telehealth: Payer: Self-pay

## 2017-08-23 VITALS — BP 110/80 | HR 76 | Temp 98.6°F | Resp 16 | Ht 65.0 in | Wt 177.2 lb

## 2017-08-23 DIAGNOSIS — F419 Anxiety disorder, unspecified: Secondary | ICD-10-CM | POA: Diagnosis not present

## 2017-08-23 NOTE — Progress Notes (Signed)
Pre visit review using our clinic review tool, if applicable. No additional management support is needed unless otherwise documented below in the visit note. 

## 2017-08-23 NOTE — Progress Notes (Signed)
   Subjective:    Patient ID: Sierra Duncan, female    DOB: Mar 17, 1976, 41 y.o.   MRN: [id number]  HPI Anxiety- pt reports sxs have improved since starting Buspar bid.  Pt reports she had some dizziness the first week but this resolved and the anxiety is doing much better.  Motivation is better, less stressed.  Husband has noticed a difference.  Pt has no interest in changing dose.   Review of Systems For ROS see HPI     Objective:   Physical Exam  Constitutional: She is oriented to person, place, and time. She appears well-developed and well-nourished. No distress.  HENT:  Head: Normocephalic and atraumatic.  Neurological: She is alert and oriented to person, place, and time.  Skin: Skin is warm and dry.  Psychiatric: She has a normal mood and affect. Her behavior is normal. Thought content normal.  Vitals reviewed.         Assessment & Plan:

## 2017-08-23 NOTE — Patient Instructions (Addendum)
Follow up as scheduled Continue the Buspar twice daily Drink plenty of fluids Call with any questions or concerns Have a great fall!!!

## 2017-08-23 NOTE — Assessment & Plan Note (Signed)
Improved since starting the Buspar.  No medication changes at this time.  Will continue to follow.

## 2017-08-23 NOTE — Telephone Encounter (Signed)
  Follow up Call-  Call back number 08/22/2017  Post procedure Call Back phone  # [phone]  Permission to leave phone message Yes  Some recent data might be hidden     Patient questions:  Do you have a fever, pain , or abdominal swelling? No. Pain Score  0 *  Have you tolerated food without any problems? Yes.    Have you been able to return to your normal activities? Yes.    Do you have any questions about your discharge instructions: Diet   No. Medications  No. Follow up visit  No.  Do you have questions or concerns about your Care? No.  Actions: * If pain score is 4 or above: No action needed, pain <4.  No problems noted per pt. maw

## 2017-08-28 ENCOUNTER — Telehealth: Payer: Self-pay | Admitting: Internal Medicine

## 2017-08-28 ENCOUNTER — Encounter (HOSPITAL_COMMUNITY)
Admission: RE | Admit: 2017-08-28 | Discharge: 2017-08-28 | Disposition: A | Discharge status: Home / Self Care | Payer: 59 | Source: Ambulatory Visit | Attending: Internal Medicine | Admitting: Internal Medicine

## 2017-08-28 DIAGNOSIS — R1011 Right upper quadrant pain: Secondary | ICD-10-CM | POA: Insufficient documentation

## 2017-08-28 MED ORDER — TECHNETIUM TC 99M MEBROFENIN IV KIT
5.0400 | PACK | Freq: Once | INTRAVENOUS | Status: AC | PRN
  Administered 2017-08-28: 5.04 via INTRAVENOUS

## 2017-08-29 NOTE — Telephone Encounter (Signed)
See result note.  

## 2017-09-13 ENCOUNTER — Ambulatory Visit (INDEPENDENT_AMBULATORY_CARE_PROVIDER_SITE_OTHER): Payer: 59 | Admitting: Family Medicine

## 2017-09-13 ENCOUNTER — Encounter: Payer: Self-pay | Admitting: Family Medicine

## 2017-09-13 VITALS — BP 116/78 | HR 78 | Temp 98.2°F | Resp 16 | Ht 65.0 in | Wt 177.0 lb

## 2017-09-13 DIAGNOSIS — E039 Hypothyroidism, unspecified: Secondary | ICD-10-CM | POA: Diagnosis not present

## 2017-09-13 DIAGNOSIS — F419 Anxiety disorder, unspecified: Secondary | ICD-10-CM | POA: Diagnosis not present

## 2017-09-13 DIAGNOSIS — E785 Hyperlipidemia, unspecified: Secondary | ICD-10-CM

## 2017-09-13 LAB — T4, FREE: Free T4: 1.05 ng/dL (ref 0.60–1.60)

## 2017-09-13 LAB — T3, FREE: T3, Free: 2.8 pg/mL (ref 2.3–4.2)

## 2017-09-13 LAB — LIPID PANEL
CHOL/HDL RATIO: 5
Cholesterol: 224 mg/dL — ABNORMAL HIGH (ref 0–200)
HDL: 43.8 mg/dL (ref >39.00)
LDL CALC: 145 mg/dL — AB (ref 0–99)
NONHDL: 179.89
Triglycerides: 172 mg/dL — ABNORMAL HIGH (ref 0.0–149.0)
VLDL: 34.4 mg/dL (ref 0.0–40.0)

## 2017-09-13 LAB — BASIC METABOLIC PANEL
BUN: 18 mg/dL (ref 6–23)
CO2: 29 meq/L (ref 19–32)
Calcium: 9.3 mg/dL (ref 8.4–10.5)
Chloride: 104 mEq/L (ref 96–112)
Creatinine, Ser: 0.62 mg/dL (ref 0.40–1.20)
GFR: 112.82 mL/min (ref >60.00)
GLUCOSE: 89 mg/dL (ref 70–99)
POTASSIUM: 4.2 meq/L (ref 3.5–5.1)
SODIUM: 139 meq/L (ref 135–145)

## 2017-09-13 LAB — HEPATIC FUNCTION PANEL
ALBUMIN: 4 g/dL (ref 3.5–5.2)
ALK PHOS: 45 U/L (ref 39–117)
ALT: 16 U/L (ref 0–35)
AST: 16 U/L (ref 0–37)
BILIRUBIN TOTAL: 0.6 mg/dL (ref 0.2–1.2)
Bilirubin, Direct: 0.1 mg/dL (ref 0.0–0.3)
TOTAL PROTEIN: 6.3 g/dL (ref 6.0–8.3)

## 2017-09-13 LAB — TSH: TSH: 2.6 u[IU]/mL (ref 0.35–4.50)

## 2017-09-13 MED ORDER — SERTRALINE HCL 25 MG PO TABS: 25.0000 mg | ORAL_TABLET | Freq: Every day | ORAL | 3 refills | Status: DC

## 2017-09-13 NOTE — Progress Notes (Signed)
   Subjective:    Patient ID: Sierra Duncan, female    DOB: 02-09-1976, 41 y.o.   MRN: [id number]  HPI PMS- pt reports that 10 days prior to her period she has having a lot of mental clouding, increased anxiety.  Pt reports anxiety improves once she has menses but it will continue to some degree.  Pt denies hot flashes- reports she is always cold instead.   Review of Systems For ROS see HPI     Objective:   Physical Exam  Constitutional: She is oriented to person, place, and time. She appears well-developed and well-nourished. No distress.  HENT:  Head: Normocephalic and atraumatic.  Eyes: Pupils are equal, round, and reactive to light. Conjunctivae and EOM are normal.  Neurological: She is alert and oriented to person, place, and time.  Skin: Skin is warm and dry.  Psychiatric: She has a normal mood and affect. Her behavior is normal. Thought content normal.  Vitals reviewed.         Assessment & Plan:

## 2017-09-13 NOTE — Progress Notes (Signed)
Pre visit review using our clinic review tool, if applicable. No additional management support is needed unless otherwise documented below in the visit note. 

## 2017-09-13 NOTE — Assessment & Plan Note (Signed)
Deteriorated.  Pt reports sxs are particularly bad for the 10 days leading up to her period.  Will start low dose Sertraline and monitor for improvement.  Pt expressed understanding and is in agreement w/ plan.

## 2017-09-13 NOTE — Assessment & Plan Note (Addendum)
Pt reports she is frequently cold and is wondering if she again needs her thyroid adjusted.  Husband is convinced that her thyroid is causing her biliary dyskinesia and this needs to be further investigated.  Check labs.  Adjust meds prn

## 2017-09-13 NOTE — Patient Instructions (Signed)
Follow up in 4-6 weeks to see how anxiety is doing We'll notify you of your lab results and make any changes if needed Start the Sertraline once daily to help w/ both anxiety and PMS symptoms Continue the Buspar twice daily Continue to work on healthy, low fat diet and regular exercise- you look great! Call with any questions or concerns Hang in there!!

## 2017-09-13 NOTE — Assessment & Plan Note (Signed)
Noted on last labs.  Since liver enzymes were elevated at last visit, did not start statin.  Encouraged healthy diet and regular exercise- which pt has been working on.  Check labs.  Adjust meds prn

## 2017-09-20 ENCOUNTER — Telehealth: Payer: Self-pay | Admitting: Family Medicine

## 2017-09-20 NOTE — Telephone Encounter (Signed)
Pt scheduled for flublok injection.

## 2017-09-20 NOTE — Telephone Encounter (Signed)
Patient is requesting egg free flu vaccine.  She wants to know if office has any doses available.

## 2017-09-22 ENCOUNTER — Ambulatory Visit (INDEPENDENT_AMBULATORY_CARE_PROVIDER_SITE_OTHER): Payer: 59

## 2017-09-22 DIAGNOSIS — Z23 Encounter for immunization: Secondary | ICD-10-CM | POA: Diagnosis not present

## 2017-10-04 DIAGNOSIS — K760 Fatty (change of) liver, not elsewhere classified: Secondary | ICD-10-CM | POA: Diagnosis not present

## 2017-10-17 ENCOUNTER — Encounter: Payer: Self-pay | Admitting: Family Medicine

## 2017-10-17 ENCOUNTER — Ambulatory Visit (INDEPENDENT_AMBULATORY_CARE_PROVIDER_SITE_OTHER): Payer: 59 | Admitting: Family Medicine

## 2017-10-17 VITALS — BP 120/81 | HR 92 | Temp 98.1°F | Resp 16 | Ht 65.0 in | Wt 176.1 lb

## 2017-10-17 DIAGNOSIS — F419 Anxiety disorder, unspecified: Secondary | ICD-10-CM

## 2017-10-17 NOTE — Progress Notes (Signed)
   Subjective:    Patient ID: Sierra Duncan, female    DOB: [birth year], 41 y.o.   MRN: [id number]  HPI Anxiety- pt was started on Sertraline at last visit in addition to Buspar.  Pt reports her initial jitteriness has improved.  Sleep is better, less 'freak out moments'.  Able to drive on the highway w/o anxiety.  Husband has noticed a big improvement.  Pt is not interested in increasing dose at this time.   Review of Systems For ROS see HPI     Objective:   Physical Exam  Constitutional: She is oriented to person, place, and time. She appears well-developed and well-nourished. No distress.  HENT:  Head: Normocephalic and atraumatic.  Neurological: She is alert and oriented to person, place, and time.  Skin: Skin is warm and dry.  Psychiatric: She has a normal mood and affect. Her behavior is normal. Thought content normal.  Vitals reviewed.         Assessment & Plan:

## 2017-10-17 NOTE — Assessment & Plan Note (Signed)
Improved since starting Sertraline.  Continue Buspar and Sertraline at current dose and continue to monitor.  Will follow and adjust meds at future visits if needed.  Pt expressed understanding and is in agreement w/ plan.

## 2017-10-17 NOTE — Patient Instructions (Signed)
Schedule your complete physical in 6 months Keep up the good work on healthy diet and regular exercise- you look great!!! No med changes at this time- you're doing great! Call with any questions or concerns Happy Early Birthday!!!

## 2017-11-20 ENCOUNTER — Other Ambulatory Visit: Payer: Self-pay | Admitting: Family Medicine

## 2017-12-14 ENCOUNTER — Other Ambulatory Visit: Payer: Self-pay | Admitting: Family Medicine

## 2017-12-21 ENCOUNTER — Telehealth: Payer: Self-pay | Admitting: Family Medicine

## 2017-12-21 NOTE — Telephone Encounter (Signed)
Copied from [place] [id number]. Topic: General - Other >> Dec 21, 2017  1:28 PM Yvette Rack wrote: Reason for CRM: patient called stating that for a week in a half she has been having diarrhea and that she think its from her zoloft she states that its not all day but sometimes each day she states that the stool is runny brown

## 2017-12-21 NOTE — Telephone Encounter (Signed)
Spoke with patient - she has been on this medication for nearly 5 months, and this issues has only been going on for the last couple of days.   Patient thinking maybe she just might have gotten a little stomach virus - the has loose stool 1-2 times over the last couple days.  Patient states there is no blood/pain - just "loose"   If she is no better by the first of the week, she will call to schedule an appointment to be seen.

## 2017-12-21 NOTE — Telephone Encounter (Signed)
Agree w/ advice given.  This is not a medication reaction- most likely a virus or a change in eating habits over the holidays

## 2017-12-29 ENCOUNTER — Other Ambulatory Visit: Payer: Self-pay | Admitting: Family Medicine

## 2017-12-29 NOTE — Telephone Encounter (Signed)
Received refill request. It says there is an allergy reaction to this medication with fluoxetine that caused hives previously. Please advise?

## 2017-12-29 NOTE — Telephone Encounter (Signed)
Doing well on Sertraline- no rxns

## 2017-12-29 NOTE — Telephone Encounter (Signed)
Medication filled to pharmacy as requested.   

## 2018-01-17 ENCOUNTER — Ambulatory Visit
Admission: RE | Admit: 2018-01-17 | Discharge: 2018-01-17 | Disposition: A | Discharge status: Home / Self Care | Payer: 59 | Source: Ambulatory Visit | Attending: Family Medicine | Admitting: Family Medicine

## 2018-01-17 ENCOUNTER — Other Ambulatory Visit: Payer: Self-pay | Admitting: Family Medicine

## 2018-01-17 DIAGNOSIS — D242 Benign neoplasm of left breast: Secondary | ICD-10-CM

## 2018-01-17 DIAGNOSIS — N6012 Diffuse cystic mastopathy of left breast: Secondary | ICD-10-CM

## 2018-01-17 DIAGNOSIS — N6320 Unspecified lump in the left breast, unspecified quadrant: Secondary | ICD-10-CM | POA: Diagnosis not present

## 2018-02-19 ENCOUNTER — Other Ambulatory Visit: Payer: Self-pay | Admitting: Family Medicine

## 2018-03-28 ENCOUNTER — Ambulatory Visit (INDEPENDENT_AMBULATORY_CARE_PROVIDER_SITE_OTHER): Payer: 59 | Admitting: Family Medicine

## 2018-03-28 ENCOUNTER — Other Ambulatory Visit: Payer: Self-pay

## 2018-03-28 ENCOUNTER — Encounter: Payer: Self-pay | Admitting: Family Medicine

## 2018-03-28 VITALS — BP 121/81 | HR 85 | Temp 98.1°F | Resp 16 | Ht 65.0 in | Wt 174.5 lb

## 2018-03-28 DIAGNOSIS — Z0000 Encounter for general adult medical examination without abnormal findings: Secondary | ICD-10-CM | POA: Diagnosis not present

## 2018-03-28 DIAGNOSIS — E559 Vitamin D deficiency, unspecified: Secondary | ICD-10-CM | POA: Diagnosis not present

## 2018-03-28 LAB — CBC WITH DIFFERENTIAL/PLATELET
BASOS ABS: 0 10*3/uL (ref 0.0–0.1)
BASOS PCT: 0.4 % (ref 0.0–3.0)
EOS ABS: 0.1 10*3/uL (ref 0.0–0.7)
Eosinophils Relative: 2.3 % (ref 0.0–5.0)
HEMATOCRIT: 42.5 % (ref 36.0–46.0)
Hemoglobin: 14.9 g/dL (ref 12.0–15.0)
LYMPHS ABS: 1.4 10*3/uL (ref 0.7–4.0)
LYMPHS PCT: 29.6 % (ref 12.0–46.0)
MCHC: 35.2 g/dL (ref 30.0–36.0)
MCV: 91.2 fl (ref 78.0–100.0)
MONO ABS: 0.4 10*3/uL (ref 0.1–1.0)
Monocytes Relative: 8 % (ref 3.0–12.0)
NEUTROS ABS: 2.9 10*3/uL (ref 1.4–7.7)
NEUTROS PCT: 59.7 % (ref 43.0–77.0)
PLATELETS: 212 10*3/uL (ref 150.0–400.0)
RBC: 4.66 Mil/uL (ref 3.87–5.11)
RDW: 12.6 % (ref 11.5–15.5)
WBC: 4.8 10*3/uL (ref 4.0–10.5)

## 2018-03-28 LAB — LIPID PANEL
CHOLESTEROL: 224 mg/dL — AB (ref 0–200)
HDL: 50 mg/dL (ref >39.00)
LDL Cholesterol: 149 mg/dL — ABNORMAL HIGH (ref 0–99)
NONHDL: 174.43
Total CHOL/HDL Ratio: 4
Triglycerides: 129 mg/dL (ref 0.0–149.0)
VLDL: 25.8 mg/dL (ref 0.0–40.0)

## 2018-03-28 LAB — BASIC METABOLIC PANEL
BUN: 15 mg/dL (ref 6–23)
CALCIUM: 9.2 mg/dL (ref 8.4–10.5)
CHLORIDE: 102 meq/L (ref 96–112)
CO2: 28 meq/L (ref 19–32)
Creatinine, Ser: 0.74 mg/dL (ref 0.40–1.20)
GFR: 91.74 mL/min (ref >60.00)
GLUCOSE: 94 mg/dL (ref 70–99)
Potassium: 4.2 mEq/L (ref 3.5–5.1)
SODIUM: 137 meq/L (ref 135–145)

## 2018-03-28 LAB — TSH: TSH: 4.16 u[IU]/mL (ref 0.35–4.50)

## 2018-03-28 LAB — VITAMIN D 25 HYDROXY (VIT D DEFICIENCY, FRACTURES): VITD: 22.95 ng/mL — AB (ref 30.00–100.00)

## 2018-03-28 LAB — HEPATIC FUNCTION PANEL
ALK PHOS: 54 U/L (ref 39–117)
ALT: 17 U/L (ref 0–35)
AST: 19 U/L (ref 0–37)
Albumin: 4.2 g/dL (ref 3.5–5.2)
BILIRUBIN DIRECT: 0.1 mg/dL (ref 0.0–0.3)
BILIRUBIN TOTAL: 0.6 mg/dL (ref 0.2–1.2)
TOTAL PROTEIN: 6.9 g/dL (ref 6.0–8.3)

## 2018-03-28 NOTE — Progress Notes (Signed)
   Subjective:    Patient ID: Sierra Duncan, female    DOB: 08-05-1976, 42 y.o.   MRN: [id number]  HPI CPE- UTD on pap, mammo (already scheduled), immunizations.   Review of Systems Patient reports no vision/ hearing changes, adenopathy,fever, weight change,  persistant/recurrent hoarseness , swallowing issues, chest pain, palpitations, edema, persistant/recurrent cough, hemoptysis, dyspnea (rest/exertional/paroxysmal nocturnal), gastrointestinal bleeding (melena, rectal bleeding), abdominal pain, significant heartburn, bowel changes, GU symptoms (dysuria, hematuria, incontinence), Gyn symptoms (abnormal  bleeding, pain),  syncope, focal weakness, memory loss, numbness & tingling, skin/hair/nail changes, abnormal bruising or bleeding, anxiety, or depression.     Objective:   Physical Exam General Appearance:    Alert, cooperative, no distress, appears stated age  Head:    Normocephalic, without obvious abnormality, atraumatic  Eyes:    PERRL, conjunctiva/corneas clear, EOM's intact, fundi    benign, both eyes  Ears:    Normal TM's and external ear canals, both ears  Nose:   Nares normal, septum midline, mucosa normal, no drainage    or sinus tenderness  Throat:   Lips, mucosa, and tongue normal; teeth and gums normal  Neck:   Supple, symmetrical, trachea midline, no adenopathy;    Thyroid: no enlargement/tenderness/nodules  Back:     Symmetric, no curvature, ROM normal, no CVA tenderness  Lungs:     Clear to auscultation bilaterally, respirations unlabored  Chest Wall:    No tenderness or deformity   Heart:    Regular rate and rhythm, S1 and S2 normal, no murmur, rub   or gallop  Breast Exam:    Deferred to mammo  Abdomen:     Soft, non-tender, bowel sounds active all four quadrants,    no masses, no organomegaly  Genitalia:    Deferred to GYN  Rectal:    Extremities:   Extremities normal, atraumatic, no cyanosis or edema  Pulses:   2+ and symmetric all extremities  Skin:   Skin  color, texture, turgor normal, no rashes or lesions  Lymph nodes:   Cervical, supraclavicular, and axillary nodes normal  Neurologic:   CNII-XII intact, normal strength, sensation and reflexes    throughout          Assessment & Plan:

## 2018-03-28 NOTE — Assessment & Plan Note (Signed)
Pt's PE WNL.  UTD on pap, mammo, Tdap.  Check labs.  Anticipatory guidance provided.

## 2018-03-28 NOTE — Assessment & Plan Note (Signed)
Check labs and replete prn. 

## 2018-03-28 NOTE — Patient Instructions (Addendum)
Follow up in 6 months to recheck mood/cholesterol We'll notify you of your lab results and make any changes if needed Continue to work on healthy diet and regular exercise- you can do it! Call with any questions or concerns Happy Spring!!!

## 2018-03-29 ENCOUNTER — Other Ambulatory Visit: Payer: Self-pay | Admitting: Emergency Medicine

## 2018-03-29 MED ORDER — VITAMIN D (ERGOCALCIFEROL) 1.25 MG (50000 UNIT) PO CAPS: 50000.0000 [IU] | ORAL_CAPSULE | ORAL | 0 refills | Status: DC

## 2018-04-23 ENCOUNTER — Other Ambulatory Visit: Payer: Self-pay | Admitting: Family Medicine

## 2018-05-01 ENCOUNTER — Other Ambulatory Visit: Payer: Self-pay | Admitting: Family Medicine

## 2018-05-01 MED ORDER — ALPRAZOLAM 0.5 MG PO TABS: 0.2500 mg | ORAL_TABLET | Freq: Two times a day (BID) | ORAL | 1 refills | Status: DC | PRN

## 2018-05-01 NOTE — Telephone Encounter (Signed)
Last OV 03/28/18 Alprazolam last filled 07/26/17 #30 with 1

## 2018-05-01 NOTE — Telephone Encounter (Signed)
Pt states that [facility] never received refill request for xanax and asking that this be called in.

## 2018-05-09 ENCOUNTER — Other Ambulatory Visit: Payer: Self-pay | Admitting: Family Medicine

## 2018-05-20 ENCOUNTER — Other Ambulatory Visit: Payer: Self-pay | Admitting: Family Medicine

## 2018-05-21 ENCOUNTER — Other Ambulatory Visit: Payer: Self-pay | Admitting: Family Medicine

## 2018-06-12 ENCOUNTER — Encounter: Payer: Self-pay | Admitting: Family Medicine

## 2018-06-12 ENCOUNTER — Other Ambulatory Visit: Payer: Self-pay | Admitting: Family Medicine

## 2018-06-12 DIAGNOSIS — E039 Hypothyroidism, unspecified: Secondary | ICD-10-CM

## 2018-06-12 MED ORDER — LEVOTHYROXINE SODIUM 75 MCG PO TABS: 75.0000 ug | ORAL_TABLET | Freq: Every day | ORAL | 3 refills | Status: DC

## 2018-06-12 NOTE — Telephone Encounter (Signed)
Please schedule pt a lab appt in 1 month to recheck TSH

## 2018-07-16 ENCOUNTER — Ambulatory Visit
Admission: RE | Admit: 2018-07-16 | Discharge: 2018-07-16 | Disposition: A | Discharge status: Home / Self Care | Payer: 59 | Source: Ambulatory Visit | Attending: Family Medicine | Admitting: Family Medicine

## 2018-07-16 DIAGNOSIS — N6012 Diffuse cystic mastopathy of left breast: Secondary | ICD-10-CM

## 2018-07-16 DIAGNOSIS — N6321 Unspecified lump in the left breast, upper outer quadrant: Secondary | ICD-10-CM | POA: Diagnosis not present

## 2018-07-16 DIAGNOSIS — R922 Inconclusive mammogram: Secondary | ICD-10-CM | POA: Diagnosis not present

## 2018-07-18 ENCOUNTER — Other Ambulatory Visit (INDEPENDENT_AMBULATORY_CARE_PROVIDER_SITE_OTHER): Payer: 59

## 2018-07-18 DIAGNOSIS — E039 Hypothyroidism, unspecified: Secondary | ICD-10-CM | POA: Diagnosis not present

## 2018-07-18 LAB — TSH: TSH: 2.05 u[IU]/mL (ref 0.35–4.50)

## 2018-08-07 ENCOUNTER — Other Ambulatory Visit: Payer: Self-pay | Admitting: Family Medicine

## 2018-08-07 NOTE — Telephone Encounter (Signed)
Received and reviewed medication refill request.  Request is appropriate and was approved.  Please see medication orders for details.  Doloris Hall,  LPN

## 2018-09-10 ENCOUNTER — Telehealth: Payer: Self-pay | Admitting: General Practice

## 2018-09-10 DIAGNOSIS — E039 Hypothyroidism, unspecified: Secondary | ICD-10-CM

## 2018-09-10 NOTE — Telephone Encounter (Signed)
[place] for referral to Dr Chalmers Cater, dx hypothyroid

## 2018-09-10 NOTE — Telephone Encounter (Signed)
Will call pt, this was just ordered today. Should be in within the next week.

## 2018-09-10 NOTE — Telephone Encounter (Signed)
Called and informed pt referral was placed.  

## 2018-09-10 NOTE — Telephone Encounter (Signed)
Copied from [place] [id number]. Topic: Quick Communication - See Telephone Encounter >> Sep 10, 2018 10:04 AM Hewitt Shorts wrote: Pt is wanting to know if the office has received the egg free flu shot yet if not when might it be coming in     Best [phone]

## 2018-09-10 NOTE — Telephone Encounter (Signed)
Ok for referral?    Copied from [place]. Topic: Referral - Request >> Sep 10, 2018  9:59 AM Hewitt Shorts wrote: Pt is wanting to get a referral to see Dr. Chalmers Cater [phone]  Best number [phone]

## 2018-09-26 ENCOUNTER — Other Ambulatory Visit: Payer: Self-pay

## 2018-09-26 ENCOUNTER — Encounter: Payer: Self-pay | Admitting: Family Medicine

## 2018-09-26 ENCOUNTER — Ambulatory Visit: Payer: 59 | Admitting: Family Medicine

## 2018-09-26 VITALS — BP 120/81 | HR 80 | Temp 98.3°F | Resp 16 | Ht 65.0 in | Wt 187.0 lb

## 2018-09-26 DIAGNOSIS — E785 Hyperlipidemia, unspecified: Secondary | ICD-10-CM

## 2018-09-26 DIAGNOSIS — E669 Obesity, unspecified: Secondary | ICD-10-CM

## 2018-09-26 DIAGNOSIS — F419 Anxiety disorder, unspecified: Secondary | ICD-10-CM

## 2018-09-26 DIAGNOSIS — E66811 Obesity, class 1: Secondary | ICD-10-CM

## 2018-09-26 LAB — BASIC METABOLIC PANEL
BUN: 17 mg/dL (ref 6–23)
CHLORIDE: 105 meq/L (ref 96–112)
CO2: 28 mEq/L (ref 19–32)
Calcium: 9.2 mg/dL (ref 8.4–10.5)
Creatinine, Ser: 0.74 mg/dL (ref 0.40–1.20)
GFR: 91.52 mL/min (ref >60.00)
Glucose, Bld: 104 mg/dL — ABNORMAL HIGH (ref 70–99)
Potassium: 3.9 mEq/L (ref 3.5–5.1)
Sodium: 139 mEq/L (ref 135–145)

## 2018-09-26 LAB — LIPID PANEL
CHOL/HDL RATIO: 5
Cholesterol: 210 mg/dL — ABNORMAL HIGH (ref 0–200)
HDL: 41.7 mg/dL (ref >39.00)
LDL CALC: 134 mg/dL — AB (ref 0–99)
NonHDL: 168.69
TRIGLYCERIDES: 174 mg/dL — AB (ref 0.0–149.0)
VLDL: 34.8 mg/dL (ref 0.0–40.0)

## 2018-09-26 LAB — CBC WITH DIFFERENTIAL/PLATELET
BASOS ABS: 0 10*3/uL (ref 0.0–0.1)
BASOS PCT: 0.4 % (ref 0.0–3.0)
EOS ABS: 0.1 10*3/uL (ref 0.0–0.7)
Eosinophils Relative: 1.8 % (ref 0.0–5.0)
HEMATOCRIT: 39.9 % (ref 36.0–46.0)
Hemoglobin: 13.7 g/dL (ref 12.0–15.0)
LYMPHS PCT: 27.7 % (ref 12.0–46.0)
Lymphs Abs: 1.6 10*3/uL (ref 0.7–4.0)
MCHC: 34.3 g/dL (ref 30.0–36.0)
MCV: 92.3 fl (ref 78.0–100.0)
Monocytes Absolute: 0.5 10*3/uL (ref 0.1–1.0)
Monocytes Relative: 8.2 % (ref 3.0–12.0)
Neutro Abs: 3.7 10*3/uL (ref 1.4–7.7)
Neutrophils Relative %: 61.9 % (ref 43.0–77.0)
Platelets: 200 10*3/uL (ref 150.0–400.0)
RBC: 4.32 Mil/uL (ref 3.87–5.11)
RDW: 12.8 % (ref 11.5–15.5)
WBC: 5.9 10*3/uL (ref 4.0–10.5)

## 2018-09-26 LAB — HEPATIC FUNCTION PANEL
ALT: 12 U/L (ref 0–35)
AST: 13 U/L (ref 0–37)
Albumin: 4 g/dL (ref 3.5–5.2)
Alkaline Phosphatase: 47 U/L (ref 39–117)
BILIRUBIN TOTAL: 0.7 mg/dL (ref 0.2–1.2)
Bilirubin, Direct: 0.1 mg/dL (ref 0.0–0.3)
Total Protein: 6.5 g/dL (ref 6.0–8.3)

## 2018-09-26 NOTE — Progress Notes (Signed)
   Subjective:    Patient ID: Sierra Duncan, female    DOB: 08-14-1976, 42 y.o.   MRN: [id number]  HPI Hyperlipidemia- pt;s last LDL 149.  Was attempting to control w/ diet and exercise but has gained 12 lbs.  Pt is exercising regularly, lifting- gaining muscle, clothes fit better.  No CP, SOB, HAs, abd pain, N/V.  Anxiety- pt on Sertraline 25mg  daily and Buspar 7.5mg  BID.  Pt reports sxs are well controlled.  Obesity- pt's BMI is now 31.1.  Pt is exercising daily.  Pt reports feeling good, exercising more.  Review of Systems For ROS see HPI     Objective:   Physical Exam  Constitutional: She is oriented to person, place, and time. She appears well-developed and well-nourished. No distress.  obese  HENT:  Head: Normocephalic and atraumatic.  Eyes: Pupils are equal, round, and reactive to light. Conjunctivae and EOM are normal.  Neck: Normal range of motion. Neck supple. No thyromegaly present.  Cardiovascular: Normal rate, regular rhythm, normal heart sounds and intact distal pulses.  No murmur heard. Pulmonary/Chest: Effort normal and breath sounds normal. No respiratory distress.  Abdominal: Soft. She exhibits no distension. There is no tenderness.  Musculoskeletal: She exhibits no edema.  Lymphadenopathy:    She has no cervical adenopathy.  Neurological: She is alert and oriented to person, place, and time.  Skin: Skin is warm and dry.  Psychiatric: She has a normal mood and affect. Her behavior is normal.  Vitals reviewed.         Assessment & Plan:

## 2018-09-26 NOTE — Assessment & Plan Note (Signed)
Applauded her efforts at exercise.  Encouraged healthy diet.  Will continue to follow.

## 2018-09-26 NOTE — Patient Instructions (Signed)
Schedule your complete physical in 6 months We'll notify you of your lab results and make any changes if needed Keep up the good work on healthy diet and regular exercise- you're doing great! Call with any questions or concerns Happy Fall!!! 

## 2018-09-26 NOTE — Assessment & Plan Note (Signed)
Noted at last visit.  Attempting to control w/ diet and exercise.  Repeat labs and determine if meds needed

## 2018-09-26 NOTE — Assessment & Plan Note (Signed)
Chronic problem.  Currently well controlled on medications.  No med changes at this time.  Will follow.

## 2018-10-04 ENCOUNTER — Encounter: Payer: Self-pay | Admitting: Family Medicine

## 2018-10-04 DIAGNOSIS — E039 Hypothyroidism, unspecified: Secondary | ICD-10-CM | POA: Diagnosis not present

## 2018-10-04 DIAGNOSIS — Z23 Encounter for immunization: Secondary | ICD-10-CM | POA: Diagnosis not present

## 2018-10-04 DIAGNOSIS — R7301 Impaired fasting glucose: Secondary | ICD-10-CM | POA: Diagnosis not present

## 2018-10-06 ENCOUNTER — Other Ambulatory Visit: Payer: Self-pay | Admitting: Family Medicine

## 2018-10-09 ENCOUNTER — Other Ambulatory Visit: Payer: Self-pay | Admitting: General Practice

## 2018-10-09 MED ORDER — LEVOTHYROXINE SODIUM 75 MCG PO TABS: ORAL_TABLET | ORAL | 1 refills | Status: DC

## 2018-10-11 DIAGNOSIS — K760 Fatty (change of) liver, not elsewhere classified: Secondary | ICD-10-CM | POA: Diagnosis not present

## 2018-11-02 ENCOUNTER — Other Ambulatory Visit: Payer: Self-pay

## 2018-11-02 ENCOUNTER — Encounter: Payer: Self-pay | Admitting: Physician Assistant

## 2018-11-02 ENCOUNTER — Ambulatory Visit: Payer: 59 | Admitting: Physician Assistant

## 2018-11-02 ENCOUNTER — Ambulatory Visit: Payer: Self-pay | Admitting: *Deleted

## 2018-11-02 VITALS — BP 112/80 | HR 94 | Temp 98.7°F | Resp 16 | Ht 65.0 in | Wt 181.0 lb

## 2018-11-02 DIAGNOSIS — B9689 Other specified bacterial agents as the cause of diseases classified elsewhere: Secondary | ICD-10-CM | POA: Diagnosis not present

## 2018-11-02 DIAGNOSIS — J208 Acute bronchitis due to other specified organisms: Secondary | ICD-10-CM | POA: Diagnosis not present

## 2018-11-02 MED ORDER — BENZONATATE 100 MG PO CAPS: 100.0000 mg | ORAL_CAPSULE | Freq: Two times a day (BID) | ORAL | 0 refills | Status: DC | PRN

## 2018-11-02 MED ORDER — DOXYCYCLINE HYCLATE 100 MG PO CAPS: 100.0000 mg | ORAL_CAPSULE | Freq: Two times a day (BID) | ORAL | 0 refills | Status: DC

## 2018-11-02 MED ORDER — ALBUTEROL SULFATE HFA 108 (90 BASE) MCG/ACT IN AERS: 2.0000 | INHALATION_SPRAY | Freq: Four times a day (QID) | RESPIRATORY_TRACT | 0 refills | Status: DC | PRN

## 2018-11-02 NOTE — Telephone Encounter (Signed)
Patient seen today by Raiford Noble, PA-C

## 2018-11-02 NOTE — Progress Notes (Signed)
Acute Office Visit  Subjective:    Patient ID: Sierra Duncan, female    DOB: February 15, 1976, 42 y.o.   MRN: [id number]  Chief Complaint  Patient presents with  . URI   HPI Patient is in today for 1 week of progressively worsening chest congestion and a cough that was initially dry but now productive of thick white sputum. Notes low-grade fever. Denies sinus congestion, sinus pressure or pain. Denies recent travel. Denies wheezing but notes SOBOE and chest tightness.  Past Medical History:  Diagnosis Date  . Anxiety   . Dislocation of right talus   . GERD (gastroesophageal reflux disease)   . Great Toe Fracture, left   . History of chicken pox   . Plantar fasciitis   . Vitamin D deficiency     Past Surgical History:  Procedure Laterality Date  . CESAREAN SECTION  2006  . TENDON REPAIR      Family History  Problem Relation Age of Onset  . Hyperlipidemia Mother   . Hypertension Mother   . Diabetes Mother   . Other Mother        fatty liver  . Hyperlipidemia Father   . Rheum arthritis Maternal Grandfather   . Healthy Daughter   . Cancer Maternal Grandmother        outside of uterine  . Colon cancer Neg Hx   . Stomach cancer Neg Hx   . Rectal cancer Neg Hx   . Esophageal cancer Neg Hx     Social History   Socioeconomic History  . Marital status: Married    Spouse name: Not on file  . Number of children: Not on file  . Years of education: Not on file  . Highest education level: Not on file  Occupational History  . Occupation: Agricultural engineer  Social Needs  . Financial resource strain: Not on file  . Food insecurity:    Worry: Not on file    Inability: Not on file  . Transportation needs:    Medical: Not on file    Non-medical: Not on file  Tobacco Use  . Smoking status: Former Smoker    Packs/day: 0.25    Years: 5.00    Pack years: 1.25    Types: Cigarettes    Last attempt to quit: 12/19/1998    Years since quitting: 19.8  . Smokeless tobacco: Never Used    Substance and Sexual Activity  . Alcohol use: Yes    Alcohol/week: 3.0 standard drinks    Types: 1 Glasses of wine, 1 Cans of beer, 1 Shots of liquor per week    Comment: 5 drinks per wk,doies weekly  . Drug use: No  . Sexual activity: Yes    Partners: Male    Birth control/protection: None  Lifestyle  . Physical activity:    Days per week: Not on file    Minutes per session: Not on file  . Stress: Not on file  Relationships  . Social connections:    Talks on phone: Not on file    Gets together: Not on file    Attends religious service: Not on file    Active member of club or organization: Not on file    Attends meetings of clubs or organizations: Not on file    Relationship status: Not on file  . Intimate partner violence:    Fear of current or ex partner: Not on file    Emotionally abused: Not on file    Physically abused: Not on  file    Forced sexual activity: Not on file  Other Topics Concern  . Not on file  Social History Narrative  . Not on file    Outpatient Medications Prior to Visit  Medication Sig Dispense Refill  . ALPRAZolam (XANAX) 0.5 MG tablet Take 0.5 tablets (0.25 mg total) by mouth 2 (two) times daily as needed for anxiety. 30 tablet 1  . busPIRone (BUSPAR) 15 MG tablet TAKE 1/2 TABLET(7.5 MG) BY MOUTH TWICE DAILY 90 tablet 3  . cetirizine (ZYRTEC) 10 MG tablet TAKE 1 TABLET(10 MG) BY MOUTH DAILY 30 tablet 6  . levothyroxine (SYNTHROID, LEVOTHROID) 75 MCG tablet TAKE 1 TABLET(75 MCG) BY MOUTH DAILY 90 tablet 1  . liothyronine (CYTOMEL) 5 MCG tablet TK 1 T PO ON AN EMPTY STOMACH ONCE A DAY  4  . sertraline (ZOLOFT) 25 MG tablet TAKE 1 TABLET BY MOUTH  EVERY DAY 30 tablet 6  . ranitidine (ZANTAC) 300 MG tablet TAKE 1 TABLET(300 MG) BY MOUTH AT BEDTIME 90 tablet 0   Facility-Administered Medications Prior to Visit  Medication Dose Route Frequency Provider Last Rate Last Dose  . gadopentetate dimeglumine (MAGNEVIST) injection 17 mL  17 mL Intravenous Once  PRN Garvin Fila, MD        Allergies  Allergen Reactions  . Codeine Anaphylaxis  . Tramadol Anaphylaxis  . Eggs Or Egg-Derived Products Hives and Other (See Comments)    inflammation  . Fluoxetine Hives  . Macrobid [Nitrofurantoin] Other (See Comments)    Loss of feeling with skin on legs  . Measles Mumps And Rubella Virus Vaccine Live Swelling    joints  . Septra [Sulfamethoxazole-Trimethoprim] Other (See Comments)    Loss of feeling with skin on legs   Review of Systems -- Pertinent ROS listed in the HPI.    Objective:    Physical Exam  Constitutional: She is oriented to person, place, and time. She appears well-developed and well-nourished.  HENT:  Head: Normocephalic and atraumatic.  Right Ear: External ear normal.  Left Ear: External ear normal.  Nose: Nose normal.  Mouth/Throat: Oropharynx is clear and moist. No oropharyngeal exudate.  Eyes: Conjunctivae are normal.  Neck: Neck supple.  Cardiovascular: Normal rate, regular rhythm, normal heart sounds and intact distal pulses.  Pulmonary/Chest: Effort normal. No respiratory distress. She has wheezes (faint). She has no rales. She exhibits no tenderness.  Neurological: She is alert and oriented to person, place, and time.  Psychiatric: She has a normal mood and affect.  Vitals reviewed.   BP 112/80   Pulse 94   Temp 98.7 F (37.1 C) (Oral)   Resp 16   Ht 5\' 5"  (1.651 m)   Wt 181 lb (82.1 kg)   SpO2 99%   BMI 30.12 kg/m  Wt Readings from Last 3 Encounters:  11/02/18 181 lb (82.1 kg)  09/26/18 187 lb (84.8 kg)  03/28/18 174 lb 8 oz (79.2 kg)    There are no preventive care reminders to display for this patient.  There are no preventive care reminders to display for this patient.   Lab Results  Component Value Date   TSH 2.05 07/18/2018   Lab Results  Component Value Date   WBC 5.9 09/26/2018   HGB 13.7 09/26/2018   HCT 39.9 09/26/2018   MCV 92.3 09/26/2018   PLT 200.0 09/26/2018   Lab  Results  Component Value Date   NA 139 09/26/2018   K 3.9 09/26/2018   CO2 28 09/26/2018  GLUCOSE 104 (H) 09/26/2018   BUN 17 09/26/2018   CREATININE 0.74 09/26/2018   BILITOT 0.7 09/26/2018   ALKPHOS 47 09/26/2018   AST 13 09/26/2018   ALT 12 09/26/2018   PROT 6.5 09/26/2018   ALBUMIN 4.0 09/26/2018   CALCIUM 9.2 09/26/2018   GFR 91.52 09/26/2018   Lab Results  Component Value Date   CHOL 210 (H) 09/26/2018   Lab Results  Component Value Date   HDL 41.70 09/26/2018   Lab Results  Component Value Date   LDLCALC 134 (H) 09/26/2018   Lab Results  Component Value Date   TRIG 174.0 (H) 09/26/2018   Lab Results  Component Value Date   CHOLHDL 5 09/26/2018   No results found for: HGBA1C     Assessment & Plan:   1. Acute bacterial bronchitis Rx Doxycycline.  Increase fluids.  Rest.  Saline nasal spray.  Probiotic.  Mucinex as directed.  Humidifier in bedroom. Tessalon and Albuterol per orders.  Call or return to clinic if symptoms are not improving.  - doxycycline (VIBRAMYCIN) 100 MG capsule; Take 1 capsule (100 mg total) by mouth 2 (two) times daily.  Dispense: 14 capsule; Refill: 0 - benzonatate (TESSALON) 100 MG capsule; Take 1 capsule (100 mg total) by mouth 2 (two) times daily as needed for cough.  Dispense: 20 capsule; Refill: 0 - albuterol (PROVENTIL HFA;VENTOLIN HFA) 108 (90 Base) MCG/ACT inhaler; Inhale 2 puffs into the lungs every 6 (six) hours as needed for wheezing or shortness of breath.  Dispense: 1 Inhaler; Refill: 0   No orders of the defined types were placed in this encounter.    Leeanne Rio, PA-C

## 2018-11-02 NOTE — Telephone Encounter (Signed)
Patient reports cough that started 3 days ago- patient was working around the house the morning and got dizzy.Patient feels her chest is so congested- it is affecting her air intake.Patient has been taking multiple OTC cough medication to treat her cough. Appointment scheduled for this afternoon for evaluation of chest congestion and associated dizzy spell. Advised increase fluid intake and slow movements throughout the day.  Reason for Disposition . [1] MILD dizziness (e.g., walking normally) AND [2] has NOT been evaluated by physician for this  (Exception: dizziness caused by heat exposure, sudden standing, or poor fluid intake)  Answer Assessment - Initial Assessment Questions 1. DESCRIPTION: "Describe your dizziness."     Very heavy and got SOB- patient had to lay down 2. LIGHTHEADED: "Do you feel lightheaded?" (e.g., somewhat faint, woozy, weak upon standing)     Not now- patient has had chest congestion with coughing for 3 days 3. VERTIGO: "Do you feel like either you or the room is spinning or tilting?" (i.e. vertigo)     no 4. SEVERITY: "How bad is it?"  "Do you feel like you are going to faint?" "Can you stand and walk?"   - MILD - walking normally   - MODERATE - interferes with normal activities (e.g., work, school)    - SEVERE - unable to stand, requires support to walk, feels like passing out now.      mild 5. ONSET:  "When did the dizziness begin?"     This morning- one episode this morning 6. AGGRAVATING FACTORS: "Does anything make it worse?" (e.g., standing, change in head position)     Patient is just sitting up now- patient was up doing laundry and household chores- moving around- got sweaty 7. HEART RATE: "Can you tell me your heart rate?" "How many beats in 15 seconds?"  (Note: not all patients can do this)       Patient did not notice change in heart rate- regular and steady now 8. CAUSE: "What do you think is causing the dizziness?"     Feels like it was the  congestion 9. RECURRENT SYMPTOM: "Have you had dizziness before?" If so, ask: "When was the last time?" "What happened that time?"     no 10. OTHER SYMPTOMS: "Do you have any other symptoms?" (e.g., fever, chest pain, vomiting, diarrhea, bleeding)       Diarrhea this morning 11. PREGNANCY: "Is there any chance you are pregnant?" "When was your last menstrual period?"        No- LMP-11/10  Protocols used: DIZZINESS Heidi Dach

## 2018-11-02 NOTE — Patient Instructions (Signed)
Take antibiotic (Doxycycline) as directed.  Increase fluids.  Get plenty of rest. Use Mucinex for congestion. Tessalon and Albuterol per orders. Take a daily probiotic (I recommend Align or Culturelle, but even Activia Yogurt may be beneficial).  A humidifier placed in the bedroom may offer some relief for a dry, scratchy throat of nasal irritation.  Read information below on acute bronchitis. Please call or return to clinic if symptoms are not improving.  Acute Bronchitis Bronchitis is when the airways that extend from the windpipe into the lungs get red, puffy, and painful (inflamed). Bronchitis often causes thick spit (mucus) to develop. This leads to a cough. A cough is the most common symptom of bronchitis. In acute bronchitis, the condition usually begins suddenly and goes away over time (usually in 2 weeks). Smoking, allergies, and asthma can make bronchitis worse. Repeated episodes of bronchitis may cause more lung problems.  HOME CARE  Rest.  Drink enough fluids to keep your pee (urine) clear or pale yellow (unless you need to limit fluids as told by your doctor).  Only take over-the-counter or prescription medicines as told by your doctor.  Avoid smoking and secondhand smoke. These can make bronchitis worse. If you are a smoker, think about using nicotine gum or skin patches. Quitting smoking will help your lungs heal faster.  Reduce the chance of getting bronchitis again by:  Washing your hands often.  Avoiding people with cold symptoms.  Trying not to touch your hands to your mouth, nose, or eyes.  Follow up with your doctor as told.  GET HELP IF: Your symptoms do not improve after 1 week of treatment. Symptoms include:  Cough.  Fever.  Coughing up thick spit.  Body aches.  Chest congestion.  Chills.  Shortness of breath.  Sore throat.  GET HELP RIGHT AWAY IF:   You have an increased fever.  You have chills.  You have severe shortness of breath.  You  have bloody thick spit (sputum).  You throw up (vomit) often.  You lose too much body fluid (dehydration).  You have a severe headache.  You faint.  MAKE SURE YOU:   Understand these instructions.  Will watch your condition.  Will get help right away if you are not doing well or get worse. Document Released: 05/23/2008 Document Revised: 08/07/2013 Document Reviewed: 05/28/2013 [facility] Patient Information 2015 [place], [place]. This information is not intended to replace advice given to you by your health care provider. Make sure you discuss any questions you have with your health care provider.

## 2018-11-05 ENCOUNTER — Other Ambulatory Visit: Payer: Self-pay | Admitting: Family Medicine

## 2018-11-20 DIAGNOSIS — E039 Hypothyroidism, unspecified: Secondary | ICD-10-CM | POA: Diagnosis not present

## 2019-01-08 ENCOUNTER — Other Ambulatory Visit: Payer: Self-pay | Admitting: General Practice

## 2019-01-08 NOTE — Telephone Encounter (Signed)
Last OV 11/02/18 Alprazolam last filled 05/01/18 #30 with 1  This is being requested by mail order pharmacy. Ok to fill for 90 days?

## 2019-01-09 MED ORDER — ALPRAZOLAM 0.5 MG PO TABS: 0.2500 mg | ORAL_TABLET | Freq: Two times a day (BID) | ORAL | 0 refills | Status: DC | PRN

## 2019-01-09 NOTE — Addendum Note (Signed)
Addended by: Davis Gourd on: 01/09/2019 01:14 PM   Modules accepted: Orders

## 2019-02-09 ENCOUNTER — Other Ambulatory Visit: Payer: Self-pay | Admitting: Family Medicine

## 2019-02-11 NOTE — Telephone Encounter (Signed)
Last OV 11/02/18 Alprazolam last filled 01/09/19 #90 with 0

## 2019-02-28 ENCOUNTER — Other Ambulatory Visit: Payer: Self-pay | Admitting: Family Medicine

## 2019-04-02 ENCOUNTER — Encounter: Payer: 59 | Admitting: Family Medicine

## 2019-04-23 ENCOUNTER — Encounter: Payer: Self-pay | Admitting: Family Medicine

## 2019-04-24 MED ORDER — BUSPIRONE HCL 15 MG PO TABS: ORAL_TABLET | ORAL | 0 refills | Status: DC

## 2019-05-03 ENCOUNTER — Encounter: Payer: Self-pay | Admitting: *Deleted

## 2019-05-13 ENCOUNTER — Other Ambulatory Visit: Payer: Self-pay | Admitting: Family Medicine

## 2019-05-14 ENCOUNTER — Encounter: Payer: 59 | Admitting: Family Medicine

## 2019-05-14 NOTE — Telephone Encounter (Signed)
Pt CPE 05/15/19

## 2019-05-15 ENCOUNTER — Telehealth: Payer: Self-pay | Admitting: Family Medicine

## 2019-05-15 ENCOUNTER — Ambulatory Visit (INDEPENDENT_AMBULATORY_CARE_PROVIDER_SITE_OTHER): Payer: 59 | Admitting: Family Medicine

## 2019-05-15 ENCOUNTER — Encounter: Payer: Self-pay | Admitting: Family Medicine

## 2019-05-15 ENCOUNTER — Other Ambulatory Visit: Payer: Self-pay

## 2019-05-15 VITALS — BP 124/86 | HR 76 | Temp 98.1°F | Resp 16 | Ht 65.0 in | Wt 187.0 lb

## 2019-05-15 DIAGNOSIS — E669 Obesity, unspecified: Secondary | ICD-10-CM | POA: Diagnosis not present

## 2019-05-15 DIAGNOSIS — M5410 Radiculopathy, site unspecified: Secondary | ICD-10-CM

## 2019-05-15 DIAGNOSIS — E785 Hyperlipidemia, unspecified: Secondary | ICD-10-CM | POA: Diagnosis not present

## 2019-05-15 DIAGNOSIS — E559 Vitamin D deficiency, unspecified: Secondary | ICD-10-CM | POA: Diagnosis not present

## 2019-05-15 DIAGNOSIS — E039 Hypothyroidism, unspecified: Secondary | ICD-10-CM | POA: Diagnosis not present

## 2019-05-15 DIAGNOSIS — Z0000 Encounter for general adult medical examination without abnormal findings: Secondary | ICD-10-CM

## 2019-05-15 DIAGNOSIS — E66811 Obesity, class 1: Secondary | ICD-10-CM

## 2019-05-15 LAB — CBC WITH DIFFERENTIAL/PLATELET
Basophils Absolute: 0 10*3/uL (ref 0.0–0.1)
Basophils Relative: 0.6 % (ref 0.0–3.0)
Eosinophils Absolute: 0.1 10*3/uL (ref 0.0–0.7)
Eosinophils Relative: 1.5 % (ref 0.0–5.0)
HCT: 43.2 % (ref 36.0–46.0)
Hemoglobin: 15.4 g/dL — ABNORMAL HIGH (ref 12.0–15.0)
Lymphocytes Relative: 20.3 % (ref 12.0–46.0)
Lymphs Abs: 1.2 10*3/uL (ref 0.7–4.0)
MCHC: 35.6 g/dL (ref 30.0–36.0)
MCV: 90.1 fl (ref 78.0–100.0)
Monocytes Absolute: 0.4 10*3/uL (ref 0.1–1.0)
Monocytes Relative: 6.4 % (ref 3.0–12.0)
Neutro Abs: 4.3 10*3/uL (ref 1.4–7.7)
Neutrophils Relative %: 71.2 % (ref 43.0–77.0)
Platelets: 213 10*3/uL (ref 150.0–400.0)
RBC: 4.79 Mil/uL (ref 3.87–5.11)
RDW: 12.9 % (ref 11.5–15.5)
WBC: 6 10*3/uL (ref 4.0–10.5)

## 2019-05-15 LAB — T4, FREE: Free T4: 0.81 ng/dL (ref 0.60–1.60)

## 2019-05-15 LAB — HEPATIC FUNCTION PANEL
ALT: 20 U/L (ref 0–35)
AST: 19 U/L (ref 0–37)
Albumin: 4.2 g/dL (ref 3.5–5.2)
Alkaline Phosphatase: 57 U/L (ref 39–117)
Bilirubin, Direct: 0.1 mg/dL (ref 0.0–0.3)
Total Bilirubin: 0.9 mg/dL (ref 0.2–1.2)
Total Protein: 6.8 g/dL (ref 6.0–8.3)

## 2019-05-15 LAB — LIPID PANEL
Cholesterol: 232 mg/dL — ABNORMAL HIGH (ref 0–200)
HDL: 47.6 mg/dL (ref >39.00)
LDL Cholesterol: 153 mg/dL — ABNORMAL HIGH (ref 0–99)
NonHDL: 184.85
Total CHOL/HDL Ratio: 5
Triglycerides: 159 mg/dL — ABNORMAL HIGH (ref 0.0–149.0)
VLDL: 31.8 mg/dL (ref 0.0–40.0)

## 2019-05-15 LAB — TSH: TSH: 2.6 u[IU]/mL (ref 0.35–4.50)

## 2019-05-15 LAB — BASIC METABOLIC PANEL
BUN: 10 mg/dL (ref 6–23)
CO2: 28 mEq/L (ref 19–32)
Calcium: 9.2 mg/dL (ref 8.4–10.5)
Chloride: 101 mEq/L (ref 96–112)
Creatinine, Ser: 0.65 mg/dL (ref 0.40–1.20)
GFR: 99.7 mL/min (ref >60.00)
Glucose, Bld: 95 mg/dL (ref 70–99)
Potassium: 4.1 mEq/L (ref 3.5–5.1)
Sodium: 137 mEq/L (ref 135–145)

## 2019-05-15 LAB — VITAMIN D 25 HYDROXY (VIT D DEFICIENCY, FRACTURES): VITD: 22.4 ng/mL — ABNORMAL LOW (ref 30.00–100.00)

## 2019-05-15 LAB — T3, FREE: T3, Free: 3.6 pg/mL (ref 2.3–4.2)

## 2019-05-15 MED ORDER — MELOXICAM 7.5 MG PO TABS: 7.5000 mg | ORAL_TABLET | Freq: Every day | ORAL | 0 refills | Status: DC

## 2019-05-15 MED ORDER — OMEPRAZOLE 20 MG PO CPDR: 20.0000 mg | DELAYED_RELEASE_CAPSULE | Freq: Every day | ORAL | 1 refills | Status: DC

## 2019-05-15 MED ORDER — CYCLOBENZAPRINE HCL 10 MG PO TABS: 10.0000 mg | ORAL_TABLET | Freq: Three times a day (TID) | ORAL | 0 refills | Status: DC | PRN

## 2019-05-15 MED ORDER — SERTRALINE HCL 25 MG PO TABS: 25.0000 mg | ORAL_TABLET | Freq: Every day | ORAL | 0 refills | Status: DC

## 2019-05-15 NOTE — Assessment & Plan Note (Signed)
Chronic problem.  Attempting to control w/ diet and exercise.  Check labs.  Adjust plan prn

## 2019-05-15 NOTE — Assessment & Plan Note (Signed)
New.  Start daily NSAID w/ Flexeril prn.  Pt expressed understanding and is in agreement w/ plan.

## 2019-05-15 NOTE — Assessment & Plan Note (Signed)
Ongoing issue for pt.  Check labs to risk stratify.  Encouraged healthy diet and regular exercise.  Will follow.

## 2019-05-15 NOTE — Telephone Encounter (Signed)
Pt called back asking if Rx wrote for today could be called into [facility] in [place] and not mail order.

## 2019-05-15 NOTE — Assessment & Plan Note (Signed)
Pt has hx of similar.  Check labs and replete prn. °

## 2019-05-15 NOTE — Patient Instructions (Signed)
Follow up in 1 year or as needed We'll notify you of your lab results and make any changes if needed Continue to work on healthy diet and regular exercise- you can do it! START the Meloxicam once daily x7-10 days and then as needed.  Take w/ food Use the cyclobenzaprine as needed for muscle spasm- may cause drowsiness Call with any questions or concerns Stay Safe!!!

## 2019-05-15 NOTE — Assessment & Plan Note (Signed)
Chronic problem.  Following w/ Endo but pt would like labs rechecked to determine if levothyroxine needs to be adjusted.

## 2019-05-15 NOTE — Progress Notes (Signed)
   Subjective:    Patient ID: Sierra Duncan, female    DOB: Nov 01, 1976, 43 y.o.   MRN: [id number]  HPI CPE- due for pap sometime this year but not currently doing procedures due to COVID.  UTD on mammo and immunizations.   Review of Systems Patient reports no vision/ hearing changes, adenopathy,fever, weight change,  persistant/recurrent hoarseness , swallowing issues, chest pain, palpitations, edema, persistant/recurrent cough, hemoptysis, dyspnea (rest/exertional/paroxysmal nocturnal), gastrointestinal bleeding (melena, rectal bleeding), abdominal pain, significant heartburn, bowel changes, GU symptoms (dysuria, hematuria, incontinence), Gyn symptoms (abnormal  bleeding, pain),  syncope, focal weakness, memory loss, numbness & tingling, skin/hair/nail changes, abnormal bruising or bleeding, anxiety, or depression.   + L sided low back pain- sxs started ~2 months ago.  Pain will burn and radiate down L hip.  Saw chiropractor w/ mild relief.    Objective:   Physical Exam General Appearance:    Alert, cooperative, no distress, appears stated age  Head:    Normocephalic, without obvious abnormality, atraumatic  Eyes:    PERRL, conjunctiva/corneas clear, EOM's intact, fundi    benign, both eyes  Ears:    Normal TM's and external ear canals, both ears  Nose:   Nares normal, septum midline, mucosa normal, no drainage    or sinus tenderness  Throat:   Lips, mucosa, and tongue normal; teeth and gums normal  Neck:   Supple, symmetrical, trachea midline, no adenopathy;    Thyroid: no enlargement/tenderness/nodules  Back:     Symmetric, no curvature, ROM normal, no CVA tenderness  Lungs:     Clear to auscultation bilaterally, respirations unlabored  Chest Wall:    No tenderness or deformity   Heart:    Regular rate and rhythm, S1 and S2 normal, no murmur, rub   or gallop  Breast Exam:    Deferred to mammo  Abdomen:     Soft, non-tender, bowel sounds active all four quadrants,    no masses, no  organomegaly  Genitalia:    Deferred  Rectal:    Extremities:   Extremities normal, atraumatic, no cyanosis or edema  Pulses:   2+ and symmetric all extremities  Skin:   Skin color, texture, turgor normal, no rashes or lesions  Lymph nodes:   Cervical, supraclavicular, and axillary nodes normal  Neurologic:   CNII-XII intact, normal strength, sensation and reflexes    throughout          Assessment & Plan:

## 2019-05-15 NOTE — Telephone Encounter (Signed)
Medication filled to pharmacy as requested.   

## 2019-05-15 NOTE — Assessment & Plan Note (Signed)
Pt's PE WNL w/ exception of obesity.  Check labs.  UTD on mammo.  Will do pap at future visit.  Anticipatory guidance provided.

## 2019-05-16 ENCOUNTER — Other Ambulatory Visit: Payer: Self-pay | Admitting: General Practice

## 2019-05-16 MED ORDER — VITAMIN D (ERGOCALCIFEROL) 1.25 MG (50000 UNIT) PO CAPS: 50000.0000 [IU] | ORAL_CAPSULE | ORAL | 0 refills | Status: DC

## 2019-06-17 ENCOUNTER — Encounter: Payer: 59 | Admitting: Family Medicine

## 2019-06-20 ENCOUNTER — Other Ambulatory Visit: Payer: Self-pay

## 2019-06-20 ENCOUNTER — Telehealth: Payer: Self-pay | Admitting: Family Medicine

## 2019-06-20 ENCOUNTER — Encounter: Payer: Self-pay | Admitting: Internal Medicine

## 2019-06-20 ENCOUNTER — Ambulatory Visit (INDEPENDENT_AMBULATORY_CARE_PROVIDER_SITE_OTHER): Payer: 59 | Admitting: Internal Medicine

## 2019-06-20 DIAGNOSIS — H6690 Otitis media, unspecified, unspecified ear: Secondary | ICD-10-CM | POA: Diagnosis not present

## 2019-06-20 DIAGNOSIS — H60331 Swimmer's ear, right ear: Secondary | ICD-10-CM | POA: Diagnosis not present

## 2019-06-20 MED ORDER — AMOXICILLIN-POT CLAVULANATE 875-125 MG PO TABS: 1.0000 | ORAL_TABLET | Freq: Two times a day (BID) | ORAL | 0 refills | Status: DC

## 2019-06-20 MED ORDER — CYCLOBENZAPRINE HCL 10 MG PO TABS: 10.0000 mg | ORAL_TABLET | Freq: Three times a day (TID) | ORAL | 0 refills | Status: DC | PRN

## 2019-06-20 MED ORDER — NEOMYCIN-POLYMYXIN-HC 3.5-10000-1 OT SOLN: 4.0000 [drp] | Freq: Four times a day (QID) | OTIC | 0 refills | Status: DC

## 2019-06-20 MED ORDER — MELOXICAM 7.5 MG PO TABS: 7.5000 mg | ORAL_TABLET | Freq: Every day | ORAL | 0 refills | Status: DC

## 2019-06-20 NOTE — Progress Notes (Signed)
Subjective:    Patient ID: Sierra Duncan, female    DOB: 09/21/1976, 43 y.o.   MRN: [id number]  HPI The patient is here for an acute visit for ear pain, neck pain, headache and ringing in ear.   Saturday, 5 days ago, [place].  The next day she woke up with noises in her right ear. Her ear started to hurt.  Later that day she had a headache and took tylenol and felt better.  Two days ago she had some right side neck discomfort, she was unsure if it was related to the ear or muscular. Wilburn Mylar she has a not of tenderness in her right neck and if she turns her head and swallows she had pain with swallowing. She has increased pain in the right side of her neck with movement of her head.  Her hearing went away a few times yesterday.  She still has noises in her ear and ear pain.     Medications and allergies reviewed with patient and updated if appropriate.  Patient Active Problem List   Diagnosis Date Noted  . Obesity (BMI 30.0-34.9) 09/26/2018  . Physical exam 03/28/2018  . Hypothyroid 09/13/2017  . Hyperlipidemia 09/13/2017  . Anxiety 07/26/2017  . Biliary colic 15/40/0867  . Radicular low back pain 04/24/2017  . GERD (gastroesophageal reflux disease) 03/27/2017  . Vitamin D deficiency 03/27/2017  . Overweight 03/27/2017  . Facial numbness 04/25/2016  . Neck pain 04/25/2016  . Facial neuralgia 04/25/2016  . Cough 10/30/2014    Current Outpatient Medications on File Prior to Visit  Medication Sig Dispense Refill  . ALPRAZolam (XANAX) 0.5 MG tablet Take 0.5 tablets (0.25 mg total) by mouth 2 (two) times daily as needed for anxiety. 90 tablet 0  . busPIRone (BUSPAR) 15 MG tablet TAKE ONE-HALF TABLET BY  MOUTH TWO TIMES DAILY 90 tablet 0  . cetirizine (ZYRTEC) 10 MG tablet TAKE 1 TABLET(10 MG) BY MOUTH DAILY 30 tablet 6  . cyclobenzaprine (FLEXERIL) 10 MG tablet Take 1 tablet (10 mg total) by mouth 3 (three) times daily as needed for muscle spasms. 30  tablet 0  . levothyroxine (SYNTHROID) 50 MCG tablet     . liothyronine (CYTOMEL) 5 MCG tablet TK 1 T PO ON AN EMPTY STOMACH ONCE A DAY  4  . meloxicam (MOBIC) 7.5 MG tablet Take 1 tablet (7.5 mg total) by mouth daily. 30 tablet 0  . omeprazole (PRILOSEC) 20 MG capsule Take 1 capsule (20 mg total) by mouth daily. 90 capsule 1  . sertraline (ZOLOFT) 25 MG tablet Take 1 tablet (25 mg total) by mouth daily. 90 tablet 0  . Vitamin D, Ergocalciferol, (DRISDOL) 1.25 MG (50000 UT) CAPS capsule Take 1 capsule (50,000 Units total) by mouth every 7 (seven) days. 12 capsule 0   Current Facility-Administered Medications on File Prior to Visit  Medication Dose Route Frequency Provider Last Rate Last Dose  . gadopentetate dimeglumine (MAGNEVIST) injection 17 mL  17 mL Intravenous Once PRN Garvin Fila, MD        Past Medical History:  Diagnosis Date  . Anxiety   . Dislocation of right talus   . GERD (gastroesophageal reflux disease)   . Great Toe Fracture, left   . History of chicken pox   . Plantar fasciitis   . Vitamin D deficiency     Past Surgical History:  Procedure Laterality Date  . CESAREAN SECTION  2006  . TENDON  REPAIR      Social History   Socioeconomic History  . Marital status: Married    Spouse name: Not on file  . Number of children: Not on file  . Years of education: Not on file  . Highest education level: Not on file  Occupational History  . Occupation: Agricultural engineer  Social Needs  . Financial resource strain: Not on file  . Food insecurity    Worry: Not on file    Inability: Not on file  . Transportation needs    Medical: Not on file    Non-medical: Not on file  Tobacco Use  . Smoking status: Former Smoker    Packs/day: 0.25    Years: 5.00    Pack years: 1.25    Types: Cigarettes    Quit date: 12/19/1998    Years since quitting: 20.5  . Smokeless tobacco: Never Used  Substance and Sexual Activity  . Alcohol use: Yes    Alcohol/week: 3.0 standard drinks     Types: 1 Glasses of wine, 1 Cans of beer, 1 Shots of liquor per week    Comment: 5 drinks per wk,doies weekly  . Drug use: No  . Sexual activity: Yes    Partners: Male    Birth control/protection: None  Lifestyle  . Physical activity    Days per week: Not on file    Minutes per session: Not on file  . Stress: Not on file  Relationships  . Social Herbalist on phone: Not on file    Gets together: Not on file    Attends religious service: Not on file    Active member of club or organization: Not on file    Attends meetings of clubs or organizations: Not on file    Relationship status: Not on file  Other Topics Concern  . Not on file  Social History Narrative  . Not on file    Family History  Problem Relation Age of Onset  . Hyperlipidemia Mother   . Hypertension Mother   . Diabetes Mother   . Other Mother        fatty liver  . Hyperlipidemia Father   . Rheum arthritis Maternal Grandfather   . Healthy Daughter   . Cancer Maternal Grandmother        outside of uterine  . Colon cancer Neg Hx   . Stomach cancer Neg Hx   . Rectal cancer Neg Hx   . Esophageal cancer Neg Hx     Review of Systems  Constitutional: Negative for chills and fever.  HENT: Positive for ear pain (into neck and jaw), hearing loss (yesterday a few times), sore throat (only when turning head to the right) and tinnitus. Negative for ear discharge and sinus pain.   Respiratory: Negative for cough, shortness of breath and wheezing.   Neurological: Positive for headaches.       Objective:   Vitals:   06/20/19 1047  BP: (!) 144/86  Pulse: 91  Resp: 16  Temp: 98.6 F (37 C)  SpO2: 99%   BP Readings from Last 3 Encounters:  06/20/19 (!) 144/86  05/15/19 124/86  11/02/18 112/80   Wt Readings from Last 3 Encounters:  06/20/19 188 lb 3.2 oz (85.4 kg)  05/15/19 187 lb (84.8 kg)  11/02/18 181 lb (82.1 kg)   Body mass index is 31.32 kg/m.   Physical Exam    GENERAL APPEARANCE:  Appears stated age, well appearing, NAD EYES: conjunctiva clear, no  icterus HEENT: left ear canal and TM normal.  Right ear canal erythematous with mild swelling, no dicharge, right TM bulging and slightly erythematous, oropharynx with no erythema, no thyromegaly, trachea midline, no cervical or supraclavicular lymphadenopathy LUNGS: Clear to auscultation without wheeze or crackles, unlabored breathing, good air entry bilaterally CARDIOVASCULAR: Normal S1,S2 without murmurs, no edema SKIN: Warm, dry      Assessment & Plan:    See Problem List for Assessment and Plan of chronic medical problems.

## 2019-06-20 NOTE — Patient Instructions (Signed)
Take the oral and use the topical antibiotic for your ear infection.    Please call if there is no improvement in your symptoms.

## 2019-06-20 NOTE — Telephone Encounter (Signed)
Please advise on in office visit    Copied from [place] [phone]. Topic: Appointment Scheduling - Scheduling Inquiry for Clinic >> Jun 19, 2019  4:54 PM Alanda Slim E wrote: Reason for CRM: Pt has ringing in ear, pain and sometimes she can not hear from right ear/ Pt would like to schedule an appt with Dr. Birdie Riddle / please advise

## 2019-06-20 NOTE — Telephone Encounter (Signed)
Patient was seen at [facility] office.

## 2019-06-21 DIAGNOSIS — H6090 Unspecified otitis externa, unspecified ear: Secondary | ICD-10-CM | POA: Insufficient documentation

## 2019-06-21 DIAGNOSIS — H6690 Otitis media, unspecified, unspecified ear: Secondary | ICD-10-CM | POA: Insufficient documentation

## 2019-06-21 NOTE — Assessment & Plan Note (Signed)
Right otitis media augmentin BID Tylenol or meloxicam for pain Call if no improvement

## 2019-06-21 NOTE — Assessment & Plan Note (Signed)
Swimmer's ear on right Start cortisporin  Drops meloxican or tylenol for pain

## 2019-07-01 IMAGING — NM NM HEPATO W/GB/PHARM/[PERSON_NAME]
1 series · 12 of 12 positions shown · non-contrast
Comparison: 07/06/2017 right upper quadrant ultrasound

CLINICAL DATA: Right upper quadrant pain and intolerance to fatty
foods. Evaluate for chronic cholecystitis.

EXAM:
NUCLEAR MEDICINE HEPATOBILIARY IMAGING WITH GALLBLADDER EF
TECHNIQUE: Sequential images of the abdomen were obtained [DATE] minutes
following intravenous administration of radiopharmaceutical. After
oral ingestion of Ensure, gallbladder ejection fraction was
determined. At 60 min, normal ejection fraction is greater than 33%.
RADIOPHARMACEUTICALS:  5.0 mCi 7c-HHm  Choletec IV

[Series 1: hepato · 4.46mm/px · 2 acquisitions, 12 frames shown]
[im 1/2]
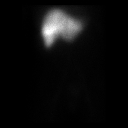
[im 1/2]
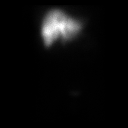
[im 1/2]
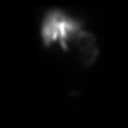
[im 1/2]
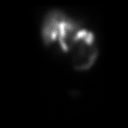
[im 1/2]
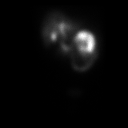
[im 1/2]
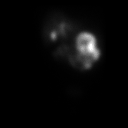
[im 2/2]
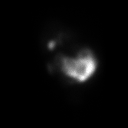
[im 2/2]
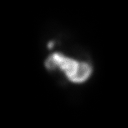
[im 2/2]
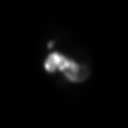
[im 2/2]
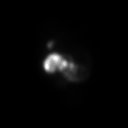
[im 2/2]
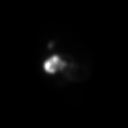
[im 2/2]
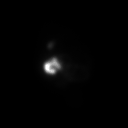

[12 of 12 positions shown; findings below may reference images not displayed]

FINDINGS: Prompt uptake and biliary excretion of activity by the liver is
seen. Gallbladder activity is visualized, consistent with patency of
cystic duct. Biliary activity passes into small bowel, consistent
with patent common bile duct.

Calculated gallbladder ejection fraction is 28%. (Normal gallbladder
ejection fraction with Ensure is greater than 33%.)
IMPRESSION: 1. No evidence acute cholecystitis.
2. Mildly decreased gallbladder ejection fraction, possibly
representing biliary dyskinesia.

## 2019-07-22 ENCOUNTER — Other Ambulatory Visit (HOSPITAL_COMMUNITY)
Admission: RE | Admit: 2019-07-22 | Discharge: 2019-07-22 | Disposition: A | Discharge status: Home / Self Care | Payer: Managed Care, Other (non HMO) | Source: Ambulatory Visit | Attending: Family Medicine | Admitting: Family Medicine

## 2019-07-22 ENCOUNTER — Ambulatory Visit (INDEPENDENT_AMBULATORY_CARE_PROVIDER_SITE_OTHER): Payer: Managed Care, Other (non HMO) | Admitting: Family Medicine

## 2019-07-22 ENCOUNTER — Encounter: Payer: Self-pay | Admitting: Family Medicine

## 2019-07-22 ENCOUNTER — Other Ambulatory Visit: Payer: Self-pay

## 2019-07-22 VITALS — BP 118/82 | HR 72 | Temp 98.2°F | Resp 16 | Ht 65.0 in | Wt 191.0 lb

## 2019-07-22 DIAGNOSIS — Z124 Encounter for screening for malignant neoplasm of cervix: Secondary | ICD-10-CM | POA: Insufficient documentation

## 2019-07-22 DIAGNOSIS — E669 Obesity, unspecified: Secondary | ICD-10-CM

## 2019-07-22 NOTE — Progress Notes (Signed)
   Subjective:    Patient ID: Sierra Duncan, female    DOB: [birth year], 43 y.o.   MRN: [id number]  HPI Pap- no concerns today.  No hx of abnormal paps.  No pain, d/c, lesions, rashes.  Due for mammo- pt to schedule  Obesity- ongoing issue.  Pt has gained 3 lbs since last visit but she is due to start her period and this could be hormonal water weight.  Walking 6-7 miles/day.   Review of Systems For ROS see HPI     Objective:   Physical Exam Exam conducted with a chaperone present.  Constitutional:      General: She is not in acute distress.    Appearance: Normal appearance. She is obese. She is not ill-appearing.  HENT:     Head: Normocephalic and atraumatic.  Eyes:     Conjunctiva/sclera: Conjunctivae normal.     Pupils: Pupils are equal, round, and reactive to light.  Neck:     Musculoskeletal: Normal range of motion.  Pulmonary:     Effort: Pulmonary effort is normal.  Abdominal:     General: Abdomen is flat. There is no distension.     Palpations: Abdomen is soft. There is no mass.     Tenderness: There is no abdominal tenderness. There is no rebound.  Genitourinary:    General: Normal vulva.     Exam position: Lithotomy position.     Pubic Area: No rash.      Labia:        Right: No rash, tenderness or lesion.        Left: No rash, tenderness or lesion.      Urethra: No prolapse or urethral lesion.     Vagina: No signs of injury and foreign body. No vaginal discharge, erythema, tenderness, bleeding or lesions.     Cervix: Cervical bleeding (scant bleeding w/ pap collection) present. No cervical motion tenderness, discharge, friability, lesion or erythema.     Uterus: Normal. Not deviated, not enlarged and not tender.      Adnexa: Right adnexa normal and left adnexa normal.  Skin:    General: Skin is warm and dry.     Findings: No rash.  Neurological:     General: No focal deficit present.     Mental Status: She is alert and oriented to person, place, and time.   Psychiatric:        Mood and Affect: Mood normal.        Behavior: Behavior normal.        Thought Content: Thought content normal.           Assessment & Plan:  Pap smear- done w/o difficulty.  Pt has never had abnormality.  Will do HPV co-testing

## 2019-07-22 NOTE — Assessment & Plan Note (Signed)
Ongoing issue for pt.  Applauded her effort at healthy diet and regular exercise.  Will continue to follow.

## 2019-07-22 NOTE — Patient Instructions (Signed)
Follow up in 1 year or as needed We'll notify you of your pap results Call and schedule your mammogram at your convenience Continue to work on healthy diet and regular exercise- you've got this! Call with any questions or concerns Stay Safe!!

## 2019-07-23 LAB — CYTOLOGY - PAP
Diagnosis: NEGATIVE
HPV: NOT DETECTED

## 2019-08-03 ENCOUNTER — Other Ambulatory Visit: Payer: Self-pay | Admitting: Family Medicine

## 2019-08-29 ENCOUNTER — Other Ambulatory Visit: Payer: Self-pay | Admitting: Family Medicine

## 2019-08-29 ENCOUNTER — Other Ambulatory Visit: Payer: Self-pay | Admitting: *Deleted

## 2019-08-29 DIAGNOSIS — N6320 Unspecified lump in the left breast, unspecified quadrant: Secondary | ICD-10-CM

## 2019-08-29 DIAGNOSIS — Z1231 Encounter for screening mammogram for malignant neoplasm of breast: Secondary | ICD-10-CM

## 2019-08-29 MED ORDER — CETIRIZINE HCL 10 MG PO TABS: 10.0000 mg | ORAL_TABLET | Freq: Every day | ORAL | 3 refills | Status: DC

## 2019-08-29 MED ORDER — BUSPIRONE HCL 15 MG PO TABS: ORAL_TABLET | ORAL | 0 refills | Status: AC

## 2019-09-04 ENCOUNTER — Telehealth: Payer: Self-pay | Admitting: Family Medicine

## 2019-09-04 MED ORDER — CETIRIZINE HCL 10 MG PO TABS: 10.0000 mg | ORAL_TABLET | Freq: Every day | ORAL | 3 refills | Status: AC

## 2019-09-04 MED ORDER — OMEPRAZOLE 20 MG PO CPDR: 20.0000 mg | DELAYED_RELEASE_CAPSULE | Freq: Every day | ORAL | 1 refills | Status: DC

## 2019-09-04 NOTE — Telephone Encounter (Signed)
With the recent depression we have 2 options- we can increase the Sertraline to 50mg  daily OR she can stop it by decreasing to a 1/2 tab x1 week and then stopping it.

## 2019-09-04 NOTE — Telephone Encounter (Signed)
Spoke with pt, she would like to stop the medication at this time.

## 2019-09-04 NOTE — Telephone Encounter (Signed)
Pt called in asking for a refill on the Zyrtec and omeprazole please send to [facility] on [address].     she also wanted to know if tabori would be ok with her stopping Sertraline, she is having episodes of depression now while taking the sertraline and the buspar together.   Pt can be reached at the home #

## 2019-09-04 NOTE — Telephone Encounter (Signed)
Zyrtec and omeprazole filled to [facility].   Please advise on stopping medication

## 2019-09-06 ENCOUNTER — Other Ambulatory Visit: Payer: Self-pay

## 2019-09-06 ENCOUNTER — Ambulatory Visit
Admission: RE | Admit: 2019-09-06 | Discharge: 2019-09-06 | Disposition: A | Discharge status: Home / Self Care | Payer: Managed Care, Other (non HMO) | Source: Ambulatory Visit | Attending: Family Medicine | Admitting: Family Medicine

## 2019-09-06 DIAGNOSIS — N6320 Unspecified lump in the left breast, unspecified quadrant: Secondary | ICD-10-CM

## 2019-09-06 DIAGNOSIS — Z1231 Encounter for screening mammogram for malignant neoplasm of breast: Secondary | ICD-10-CM

## 2019-09-16 ENCOUNTER — Telehealth: Payer: Self-pay | Admitting: Family Medicine

## 2019-09-16 NOTE — Telephone Encounter (Signed)
Please advise 

## 2019-09-16 NOTE — Telephone Encounter (Signed)
Is she taking a 1/2 tab or a full tab?  If taking a full tab, decrease to half.  If taking a 1/2, we can send in Buspar 5mg  BID, #60, 3 refills

## 2019-09-16 NOTE — Telephone Encounter (Signed)
Pt said that she does not want a new prescription she will just try to spit the pill in the 3's. So she is taking 5mg  BID.

## 2019-09-16 NOTE — Telephone Encounter (Signed)
Patient called in and said when she takes the Buspar she gets very dizzy even if she take in the morning or at bed time. Patient would like to be advised if she should stop taking it or if she needs a lower does. Please advise.

## 2019-09-17 ENCOUNTER — Encounter: Payer: Self-pay | Admitting: Family Medicine

## 2019-11-18 ENCOUNTER — Telehealth: Payer: Self-pay

## 2019-11-18 NOTE — Telephone Encounter (Signed)
Unable to LMOVM offering office visit, mailbox full   Patient Name: Sierra Duncan Gender: Female DOB: [birth year] Age: 43 Y 16 D Return Phone Number: [phone] (Primary) Address: City/State/Zip: [place] [address] [facility] - Night Physician Dimple Nanas- MD Contact Type Call Who Is Calling Patient / Member / Family / Caregiver Call Type Triage / Clinical Relationship To Patient Self Return Phone Number [phone] (Primary) Chief Complaint Abdominal Pain Reason for Call Symptomatic / Request for [place] states she abdominal pain on her right side and pain under her right shoulder when she eats. Translation No Nurse Assessment Nurse: Joya Gaskins, RN, Vonna Kotyk Date/Time Eilene Ghazi Time): 11/16/2019 4:42:44 PM Confirm and document reason for call. If symptomatic, describe symptoms. ---Caller states that she abdominal pain on her right side and pain under her right shoulder when she eats. She had this a couple years ago. This current episode has been present for about a week. Has the patient had close contact with a person known or suspected to have the novel coronavirus illness OR traveled / lives in area with major community spread (including international travel) in the last 14 days from the onset of symptoms? * If Asymptomatic, screen for exposure and travel within the last 14 days. ---No Does the patient have any new or worsening symptoms? ---Yes Will a triage be completed? ---Yes Related visit to physician within the last 2 weeks? ---No Does the PT have any chronic conditions? (i.e. diabetes, asthma, this includes High risk factors for pregnancy, etc.) ---No Is the patient pregnant or possibly pregnant? (Ask all females between the ages of 67-55) ---No Is this a behavioral health or substance abuse call? ---No Guidelines Guideline  Title Affirmed Question Affirmed Notes Nurse Date/Time (Eastern Time) Abdominal Pain - Upper [1] MILD-MODERATE pain AND [2] constant AND [3] present > 2 hours Rachel Moulds 11/16/2019 4:44:18 PM PLEASE NOTE: All timestamps contained within this report are represented as [place] Standard Time. CONFIDENTIALTY NOTICE: This fax transmission is intended only for the addressee. It contains information that is legally privileged, confidential or otherwise protected from use or disclosure. If you are not the intended recipient, you are strictly prohibited from reviewing, disclosing, copying using or disseminating any of this information or taking any action in reliance on or regarding this information. If you have received this fax in error, please notify us immediately by telephone so that we can arrange for its return to [place]. Phone: [phone], Toll-Free: [phone], Fax: [phone] Page: 2 of 2 Call Id: [id number] [place]. Time Eilene Ghazi Time) Disposition Final User 11/16/2019 4:48:04 PM See HCP within 4 Hours (or PCP triage) Yes Joya Gaskins, RN, Aviva Kluver Disagree/Comply Disagree Caller Understands Yes PreDisposition Did not know what to do Care Advice Given Per Guideline SEE HCP WITHIN 4 HOURS (OR PCP TRIAGE): * IF OFFICE WILL BE CLOSED AND NO PCP (PRIMARY CARE PROVIDER) SECOND-LEVEL TRIAGE: You need to be seen within the next 3 or 4 hours. A nearby Urgent Care Center [facility]) is often a good source of care. Another choice is to go to the ED. Go sooner if you become worse. CALL BACK IF: * You become worse. CARE ADVICE given per Abdominal Pain, Upper (Adult) guideline.

## 2020-01-02 ENCOUNTER — Other Ambulatory Visit: Payer: Self-pay | Admitting: Emergency Medicine

## 2020-01-02 NOTE — Telephone Encounter (Signed)
LOV: 07/22/19 Obesity Last rx Xanax 01/09/19 #90

## 2020-01-03 MED ORDER — ALPRAZOLAM 0.5 MG PO TABS: 0.2500 mg | ORAL_TABLET | Freq: Two times a day (BID) | ORAL | 0 refills | Status: DC | PRN

## 2020-03-31 ENCOUNTER — Other Ambulatory Visit: Payer: Self-pay | Admitting: Family Medicine

## 2020-05-15 ENCOUNTER — Other Ambulatory Visit: Payer: Self-pay

## 2020-05-15 ENCOUNTER — Ambulatory Visit (INDEPENDENT_AMBULATORY_CARE_PROVIDER_SITE_OTHER): Payer: Managed Care, Other (non HMO) | Admitting: Family Medicine

## 2020-05-15 ENCOUNTER — Other Ambulatory Visit: Payer: Self-pay | Admitting: General Practice

## 2020-05-15 ENCOUNTER — Encounter: Payer: Self-pay | Admitting: Family Medicine

## 2020-05-15 VITALS — BP 138/102 | HR 86 | Temp 97.9°F | Resp 16 | Ht 65.0 in | Wt 186.5 lb

## 2020-05-15 DIAGNOSIS — I10 Essential (primary) hypertension: Secondary | ICD-10-CM | POA: Diagnosis not present

## 2020-05-15 DIAGNOSIS — E669 Obesity, unspecified: Secondary | ICD-10-CM

## 2020-05-15 DIAGNOSIS — E66811 Obesity, class 1: Secondary | ICD-10-CM

## 2020-05-15 DIAGNOSIS — E559 Vitamin D deficiency, unspecified: Secondary | ICD-10-CM

## 2020-05-15 DIAGNOSIS — Z0000 Encounter for general adult medical examination without abnormal findings: Secondary | ICD-10-CM

## 2020-05-15 LAB — LIPID PANEL
Cholesterol: 224 mg/dL — ABNORMAL HIGH (ref 0–200)
HDL: 37.9 mg/dL — ABNORMAL LOW (ref >39.00)
NonHDL: 186.49
Total CHOL/HDL Ratio: 6
Triglycerides: 319 mg/dL — ABNORMAL HIGH (ref 0.0–149.0)
VLDL: 63.8 mg/dL — ABNORMAL HIGH (ref 0.0–40.0)

## 2020-05-15 LAB — CBC WITH DIFFERENTIAL/PLATELET
Basophils Absolute: 0.1 10*3/uL (ref 0.0–0.1)
Basophils Relative: 1.1 % (ref 0.0–3.0)
Eosinophils Absolute: 0.1 10*3/uL (ref 0.0–0.7)
Eosinophils Relative: 1.5 % (ref 0.0–5.0)
HCT: 42.5 % (ref 36.0–46.0)
Hemoglobin: 14.6 g/dL (ref 12.0–15.0)
Lymphocytes Relative: 20.7 % (ref 12.0–46.0)
Lymphs Abs: 1.4 10*3/uL (ref 0.7–4.0)
MCHC: 34.3 g/dL (ref 30.0–36.0)
MCV: 90.6 fl (ref 78.0–100.0)
Monocytes Absolute: 0.5 10*3/uL (ref 0.1–1.0)
Monocytes Relative: 7 % (ref 3.0–12.0)
Neutro Abs: 4.6 10*3/uL (ref 1.4–7.7)
Neutrophils Relative %: 69.7 % (ref 43.0–77.0)
Platelets: 217 10*3/uL (ref 150.0–400.0)
RBC: 4.69 Mil/uL (ref 3.87–5.11)
RDW: 12.6 % (ref 11.5–15.5)
WBC: 6.7 10*3/uL (ref 4.0–10.5)

## 2020-05-15 LAB — HEPATIC FUNCTION PANEL
ALT: 28 U/L (ref 0–35)
AST: 20 U/L (ref 0–37)
Albumin: 4.2 g/dL (ref 3.5–5.2)
Alkaline Phosphatase: 53 U/L (ref 39–117)
Bilirubin, Direct: 0.1 mg/dL (ref 0.0–0.3)
Total Bilirubin: 0.7 mg/dL (ref 0.2–1.2)
Total Protein: 6.5 g/dL (ref 6.0–8.3)

## 2020-05-15 LAB — BASIC METABOLIC PANEL
BUN: 12 mg/dL (ref 6–23)
CO2: 27 mEq/L (ref 19–32)
Calcium: 9.4 mg/dL (ref 8.4–10.5)
Chloride: 102 mEq/L (ref 96–112)
Creatinine, Ser: 0.66 mg/dL (ref 0.40–1.20)
GFR: 97.5 mL/min (ref >60.00)
Glucose, Bld: 99 mg/dL (ref 70–99)
Potassium: 4.2 mEq/L (ref 3.5–5.1)
Sodium: 137 mEq/L (ref 135–145)

## 2020-05-15 LAB — LDL CHOLESTEROL, DIRECT: Direct LDL: 133 mg/dL

## 2020-05-15 LAB — VITAMIN D 25 HYDROXY (VIT D DEFICIENCY, FRACTURES): VITD: 16.43 ng/mL — ABNORMAL LOW (ref 30.00–100.00)

## 2020-05-15 LAB — TSH: TSH: 1.82 u[IU]/mL (ref 0.35–4.50)

## 2020-05-15 MED ORDER — HYDROCHLOROTHIAZIDE 12.5 MG PO TABS: 12.5000 mg | ORAL_TABLET | Freq: Every day | ORAL | 3 refills | Status: AC

## 2020-05-15 MED ORDER — ALPRAZOLAM 0.5 MG PO TABS: 0.2500 mg | ORAL_TABLET | Freq: Two times a day (BID) | ORAL | 1 refills | Status: AC | PRN

## 2020-05-15 NOTE — Patient Instructions (Signed)
Follow up in 1 month to recheck BP and check metabolic panel We'll notify you of your lab results and make any changes if needed START the Hydrochlorothiazide (HCTZ) daily for BP and fluid retention Keep up the good work on healthy diet and regular exercise- you look great! Call with any questions or concerns Have a great summer!!!

## 2020-05-15 NOTE — Progress Notes (Signed)
   Subjective:    Patient ID: Sierra Duncan, female    DOB: 23-May-1976, 44 y.o.   MRN: [id number]  HPI CPE- UTD on pap, mammo, flu, Tdap.  Has moved to [place].  Pt started working for the 1st time in 11 yrs- increased stress level  Elevated BP- pt reports after 2nd COVID vaccine she had a reaction which caused severe elevation in BP that required her to go to ER- 188/120.  Pt's BP returned to normal and she felt immediately better.  Recently she has again been feeling poorly and she started checking BP and DBP has been elevated.  Mom has HTN   Review of Systems Patient reports no vision/ hearing changes, adenopathy,fever, weight change,  persistant/recurrent hoarseness , swallowing issues, chest pain, edema, persistant/recurrent cough, hemoptysis, dyspnea (rest/exertional/paroxysmal nocturnal), gastrointestinal bleeding (melena, rectal bleeding), abdominal pain, significant heartburn, bowel changes, GU symptoms (dysuria, hematuria, incontinence), Gyn symptoms (abnormal  bleeding, pain),  syncope, focal weakness, memory loss, numbness & tingling, skin/hair/nail changes, abnormal bruising or bleeding, anxiety, or depression.   + palpitations- wore a heart monitor x2 weeks and has cards appt upcoming.  Had ECHO and stress test.  Was told to have OSA workup.    + swelling/fluid retention  This visit occurred during the SARS-CoV-2 public health emergency.  Safety protocols were in place, including screening questions prior to the visit, additional usage of staff PPE, and extensive cleaning of exam room while observing appropriate contact time as indicated for disinfecting solutions.       Objective:   Physical Exam General Appearance:    Alert, cooperative, no distress, appears stated age  Head:    Normocephalic, without obvious abnormality, atraumatic  Eyes:    PERRL, conjunctiva/corneas clear, EOM's intact, fundi    benign, both eyes  Ears:    Normal TM's and external ear canals, both  ears  Nose:   Deferred due to COVID  Throat:   Neck:   Supple, symmetrical, trachea midline, no adenopathy;    Thyroid: no enlargement/tenderness/nodules  Back:     Symmetric, no curvature, ROM normal, no CVA tenderness  Lungs:     Clear to auscultation bilaterally, respirations unlabored  Chest Wall:    No tenderness or deformity   Heart:    Regular rate and rhythm, S1 and S2 normal, no murmur, rub   or gallop  Breast Exam:    Deferred to GYN  Abdomen:     Soft, non-tender, bowel sounds active all four quadrants,    no masses, no organomegaly  Genitalia:    Deferred to GYN  Rectal:    Extremities:   Extremities normal, atraumatic, no cyanosis or edema  Pulses:   2+ and symmetric all extremities  Skin:   Skin color, texture, turgor normal, no rashes or lesions  Lymph nodes:   Cervical, supraclavicular, and axillary nodes normal  Neurologic:   CNII-XII intact, normal strength, sensation and reflexes    throughout          Assessment & Plan:

## 2020-05-15 NOTE — Assessment & Plan Note (Signed)
Ongoing issue for pt.  She has lost weight recently and I applauded her efforts.  Check labs to risk stratify.  Will follow.

## 2020-05-15 NOTE — Assessment & Plan Note (Signed)
Pt's PE WNL w/ exception of obesity.  UTD on pap, mammo, immunizations.  Check labs.  Anticipatory guidance provided.  

## 2020-05-15 NOTE — Telephone Encounter (Signed)
Last OV today  Xanax last filled 01/03/20 #90 with 0

## 2020-05-15 NOTE — Assessment & Plan Note (Signed)
Check labs and replete prn. 

## 2020-05-15 NOTE — Assessment & Plan Note (Signed)
New.  Pt's BP has been elevated previously but nothing like it was after her 2nd COVID vaccine.  BP normalized after vaccine but recently DBP has been running high- often >100.  She reports a feeling of 'heaviness' when BP is elevated but denies other symptoms.  Reports water retention.  Will start HCTZ daily.  Discussed possible cross reactivity w/ Sulfa but pt is agreeable to try.  Will follow.

## 2020-05-20 ENCOUNTER — Other Ambulatory Visit: Payer: Self-pay | Admitting: General Practice

## 2020-05-20 MED ORDER — VITAMIN D (ERGOCALCIFEROL) 1.25 MG (50000 UNIT) PO CAPS: 50000.0000 [IU] | ORAL_CAPSULE | ORAL | 0 refills | Status: AC

## 2020-09-11 ENCOUNTER — Other Ambulatory Visit: Payer: Self-pay | Admitting: Family Medicine

## 2021-06-16 ENCOUNTER — Encounter: Payer: Self-pay | Admitting: *Deleted
